# Patient Record
Sex: Female | Born: 1990 | Hispanic: Yes | Marital: Married | Smoking: Never smoker
Health system: Southern US, Community
[De-identification: ages and names within clinical notes are randomized; demographics above are authoritative.]

## PROBLEM LIST (undated history)

## (undated) DIAGNOSIS — E282 Polycystic ovarian syndrome: Secondary | ICD-10-CM

## (undated) DIAGNOSIS — I7300 Raynaud's syndrome without gangrene: Secondary | ICD-10-CM

## (undated) DIAGNOSIS — G4700 Insomnia, unspecified: Secondary | ICD-10-CM

## (undated) DIAGNOSIS — F329 Major depressive disorder, single episode, unspecified: Secondary | ICD-10-CM

## (undated) DIAGNOSIS — F419 Anxiety disorder, unspecified: Secondary | ICD-10-CM

## (undated) DIAGNOSIS — Z8739 Personal history of other diseases of the musculoskeletal system and connective tissue: Secondary | ICD-10-CM

## (undated) DIAGNOSIS — F32A Depression, unspecified: Secondary | ICD-10-CM

## (undated) HISTORY — PX: ANKLE FRACTURE SURGERY: SHX122

---
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
## 2011-01-19 ENCOUNTER — Ambulatory Visit: Payer: Self-pay | Admitting: Family Medicine

## 2011-08-30 ENCOUNTER — Inpatient Hospital Stay: Payer: Self-pay | Admitting: Obstetrics and Gynecology

## 2011-08-30 LAB — CBC WITH DIFFERENTIAL/PLATELET
Basophil #: 0 x10 3/mm 3
Basophil %: 0.3 %
Eosinophil #: 0.2 x10 3/mm 3
Eosinophil %: 1.8 %
HCT: 39.6 %
HGB: 13.4 g/dL
Lymphocyte %: 21.6 %
Lymphs Abs: 2.5 x10 3/mm 3
MCH: 30.4 pg
MCHC: 33.8 g/dL
MCV: 90 fL
Monocyte #: 0.7 x10 3/mm 3
Monocyte %: 6.4 %
Neutrophil #: 7.9 x10 3/mm 3 — ABNORMAL HIGH
Neutrophil %: 69.9 %
Platelet: 259 x10 3/mm 3
RBC: 4.41 X10 6/mm 3
RDW: 13.7 %
WBC: 11.3 x10 3/mm 3 — ABNORMAL HIGH

## 2011-08-30 LAB — PIH PROFILE
Anion Gap: 13
BUN: 7 mg/dL
Calcium, Total: 8.2 mg/dL — ABNORMAL LOW
Chloride: 107 mmol/L
Co2: 21 mmol/L
Creatinine: 0.59 mg/dL — ABNORMAL LOW
EGFR (African American): >60
EGFR (Non-African Amer.): >60
Glucose: 70 mg/dL
HCT: 39.7 %
HGB: 13.3 g/dL
MCH: 30.4 pg
MCHC: 33.4 g/dL
MCV: 91 fL
Osmolality: 278
Platelet: 234 x10 3/mm 3
Potassium: 3.8 mmol/L
RBC: 4.37 X10 6/mm 3
RDW: 13.3 %
SGOT(AST): 28 U/L
Sodium: 141 mmol/L
Uric Acid: 5.7 mg/dL
WBC: 17.7 x10 3/mm 3 — ABNORMAL HIGH

## 2011-08-31 LAB — HEMATOCRIT: HCT: 31.5 % — ABNORMAL LOW (ref 35.0–47.0)

## 2014-10-16 NOTE — H&P (Signed)
L&D Evaluation:  History:   HPI 24 yo G1P0 with LMP of 11/18/10 & EDD of 08/25/11 & L 19 with EDD of 08/20/11 w/ PNC at [facility] significant for Tdap out of date, PMH of pelvic fx w/Rt hip dislocation after fall from rooftop, ROM adequate for vaginal delivery presents to [facility] w/active labor and 3-4 cms.    Patient's Medical History Lactose Intolerance,, Reynaud's, HA's, pelvic fx, UTI    Patient's Surgical History none    Medications Pre Natal Vitamins    Allergies NKDA    Social History none    Family History Non-Contributory   ROS:   ROS All systems were reviewed.  HEENT, CNS, GI, GU, Respiratory, CV, Renal and Musculoskeletal systems were found to be normal.   Exam:   Vital Signs stable    General no apparent distress    Mental Status clear    Chest clear    Heart normal sinus rhythm, no murmur/gallop/rubs    Abdomen gravid, non-tender    Estimated Fetal Weight Average for gestational age    Fetal Position vtx    Back no CVAT    Edema 1+    Reflexes 1+    Clonus negative    Pelvic 3-4/50/vtx    Mebranes Intact    FHT normal rate with no decels, +accels, no decels    Ucx regular    Skin dry    Lymph no lymphadenopathy   Impression:   Impression active labor   Plan:   Plan monitor contractions and for cervical change    Comments GBS neg. Birth plan reviewed and will utilize per pt request. Plans epidural.   Electronic Signatures: Sharee Pimple (CNM)  (Signed 24-Mar-13 06:58)  Authored: L&D Evaluation   Last Updated: 24-Mar-13 06:58 by Sharee Pimple (CNM)

## 2015-03-07 ENCOUNTER — Other Ambulatory Visit: Payer: Self-pay | Admitting: Rheumatology

## 2015-03-07 DIAGNOSIS — R1310 Dysphagia, unspecified: Secondary | ICD-10-CM

## 2015-03-07 DIAGNOSIS — M349 Systemic sclerosis, unspecified: Secondary | ICD-10-CM

## 2015-03-11 ENCOUNTER — Ambulatory Visit: Admission: RE | Admit: 2015-03-11 | Payer: Managed Care, Other (non HMO) | Source: Ambulatory Visit

## 2015-03-26 DIAGNOSIS — E282 Polycystic ovarian syndrome: Secondary | ICD-10-CM | POA: Insufficient documentation

## 2015-03-26 DIAGNOSIS — I7300 Raynaud's syndrome without gangrene: Secondary | ICD-10-CM | POA: Insufficient documentation

## 2015-04-02 ENCOUNTER — Ambulatory Visit
Admission: RE | Admit: 2015-04-02 | Discharge: 2015-04-02 | Disposition: A | Discharge status: Home / Self Care | Payer: Managed Care, Other (non HMO) | Source: Ambulatory Visit | Attending: Rheumatology | Admitting: Rheumatology

## 2015-04-02 DIAGNOSIS — M349 Systemic sclerosis, unspecified: Secondary | ICD-10-CM

## 2015-04-02 DIAGNOSIS — R1310 Dysphagia, unspecified: Secondary | ICD-10-CM | POA: Insufficient documentation

## 2015-04-02 DIAGNOSIS — K219 Gastro-esophageal reflux disease without esophagitis: Secondary | ICD-10-CM | POA: Diagnosis not present

## 2018-03-04 ENCOUNTER — Encounter
Admission: EM | Disposition: A | Discharge status: Home / Self Care | Payer: Self-pay | Source: Home / Self Care | Attending: Emergency Medicine

## 2018-03-04 ENCOUNTER — Emergency Department: Payer: Managed Care, Other (non HMO) | Admitting: Anesthesiology

## 2018-03-04 ENCOUNTER — Emergency Department
Admission: EM | Admit: 2018-03-04 | Discharge: 2018-03-04 | Disposition: A | Discharge status: Home / Self Care | Payer: Managed Care, Other (non HMO) | Attending: Emergency Medicine | Admitting: Emergency Medicine

## 2018-03-04 ENCOUNTER — Encounter: Payer: Self-pay | Admitting: Emergency Medicine

## 2018-03-04 DIAGNOSIS — K210 Gastro-esophageal reflux disease with esophagitis: Secondary | ICD-10-CM | POA: Diagnosis not present

## 2018-03-04 DIAGNOSIS — I7300 Raynaud's syndrome without gangrene: Secondary | ICD-10-CM | POA: Diagnosis not present

## 2018-03-04 DIAGNOSIS — K224 Dyskinesia of esophagus: Secondary | ICD-10-CM | POA: Diagnosis not present

## 2018-03-04 DIAGNOSIS — M349 Systemic sclerosis, unspecified: Secondary | ICD-10-CM | POA: Diagnosis not present

## 2018-03-04 DIAGNOSIS — R1310 Dysphagia, unspecified: Secondary | ICD-10-CM | POA: Insufficient documentation

## 2018-03-04 HISTORY — DX: Raynaud's syndrome without gangrene: I73.00

## 2018-03-04 HISTORY — PX: ESOPHAGOGASTRODUODENOSCOPY (EGD) WITH PROPOFOL: SHX5813

## 2018-03-04 LAB — COMPREHENSIVE METABOLIC PANEL
ALT: 54 U/L — ABNORMAL HIGH (ref 0–44)
AST: 26 U/L (ref 15–41)
Albumin: 4.1 g/dL (ref 3.5–5.0)
Alkaline Phosphatase: 55 U/L (ref 38–126)
Anion gap: 9 (ref 5–15)
BUN: 9 mg/dL (ref 6–20)
CO2: 28 mmol/L (ref 22–32)
Calcium: 9.1 mg/dL (ref 8.9–10.3)
Chloride: 100 mmol/L (ref 98–111)
Creatinine, Ser: 0.54 mg/dL (ref 0.44–1.00)
GFR calc Af Amer: >60 mL/min (ref >60)
GFR calc non Af Amer: >60 mL/min (ref >60)
Glucose, Bld: 99 mg/dL (ref 70–99)
Potassium: 5.3 mmol/L — ABNORMAL HIGH (ref 3.5–5.1)
Sodium: 137 mmol/L (ref 135–145)
Total Bilirubin: 1.8 mg/dL — ABNORMAL HIGH (ref 0.3–1.2)
Total Protein: 7.8 g/dL (ref 6.5–8.1)

## 2018-03-04 LAB — CBC
HCT: 42.2 % (ref 35.0–47.0)
Hemoglobin: 14.3 g/dL (ref 12.0–16.0)
MCH: 28.8 pg (ref 26.0–34.0)
MCHC: 33.9 g/dL (ref 32.0–36.0)
MCV: 84.9 fL (ref 80.0–100.0)
Platelets: 122 10*3/uL — ABNORMAL LOW (ref 150–440)
RBC: 4.97 MIL/uL (ref 3.80–5.20)
RDW: 13.5 % (ref 11.5–14.5)
WBC: 11.2 10*3/uL — ABNORMAL HIGH (ref 3.6–11.0)

## 2018-03-04 LAB — LIPASE, BLOOD: Lipase: 30 U/L (ref 11–51)

## 2018-03-04 SURGERY — ESOPHAGOGASTRODUODENOSCOPY (EGD) WITH PROPOFOL
Anesthesia: General

## 2018-03-04 MED ORDER — MIDAZOLAM HCL 5 MG/5ML IJ SOLN
INTRAMUSCULAR | Status: DC | PRN
  Administered 2018-03-04: 2 mg via INTRAVENOUS

## 2018-03-04 MED ORDER — GI COCKTAIL ~~LOC~~
30.0000 mL | Freq: Once | ORAL | Status: AC
  Administered 2018-03-04: 30 mL via ORAL
  Filled 2018-03-04: qty 30

## 2018-03-04 MED ORDER — FENTANYL CITRATE (PF) 100 MCG/2ML IJ SOLN
INTRAMUSCULAR | Status: DC | PRN
  Administered 2018-03-04 (×2): 50 ug via INTRAVENOUS

## 2018-03-04 MED ORDER — LIDOCAINE HCL (PF) 2 % IJ SOLN
INTRAMUSCULAR | Status: DC | PRN
  Administered 2018-03-04: 60 mg

## 2018-03-04 MED ORDER — LIDOCAINE HCL (PF) 2 % IJ SOLN
INTRAMUSCULAR | Status: AC
  Filled 2018-03-04: qty 20

## 2018-03-04 MED ORDER — PROPOFOL 500 MG/50ML IV EMUL
INTRAVENOUS | Status: DC | PRN
  Administered 2018-03-04: 70 ug/kg/min via INTRAVENOUS

## 2018-03-04 MED ORDER — FENTANYL CITRATE (PF) 100 MCG/2ML IJ SOLN
INTRAMUSCULAR | Status: AC
  Filled 2018-03-04: qty 2

## 2018-03-04 MED ORDER — PROPOFOL 10 MG/ML IV BOLUS
INTRAVENOUS | Status: DC | PRN
  Administered 2018-03-04: 20 mg via INTRAVENOUS
  Administered 2018-03-04: 30 mg via INTRAVENOUS

## 2018-03-04 MED ORDER — OMEPRAZOLE 40 MG PO CPDR: 40.0000 mg | DELAYED_RELEASE_CAPSULE | Freq: Two times a day (BID) | ORAL | 0 refills | Status: DC

## 2018-03-04 MED ORDER — MIDAZOLAM HCL 2 MG/2ML IJ SOLN
INTRAMUSCULAR | Status: AC
  Filled 2018-03-04: qty 2

## 2018-03-04 MED ORDER — ONDANSETRON HCL 4 MG/2ML IJ SOLN
4.0000 mg | Freq: Once | INTRAMUSCULAR | Status: AC
  Administered 2018-03-04: 4 mg via INTRAVENOUS
  Filled 2018-03-04: qty 2

## 2018-03-04 MED ORDER — SUCRALFATE 1 GM/10ML PO SUSP: 1.0000 g | Freq: Three times a day (TID) | ORAL | 0 refills | Status: DC

## 2018-03-04 MED ORDER — SODIUM CHLORIDE 0.9 % IV SOLN
INTRAVENOUS | Status: DC
  Administered 2018-03-04: 1000 mL via INTRAVENOUS

## 2018-03-04 MED ORDER — SODIUM CHLORIDE 0.9 % IV SOLN
1000.0000 mL | Freq: Once | INTRAVENOUS | Status: AC
  Administered 2018-03-04: 1000 mL via INTRAVENOUS

## 2018-03-04 MED ORDER — PROPOFOL 500 MG/50ML IV EMUL
INTRAVENOUS | Status: AC
  Filled 2018-03-04: qty 50

## 2018-03-04 NOTE — Transfer of Care (Signed)
Immediate Anesthesia Transfer of Care Note  Patient: Angelica Valenzuela  Procedure(s) Performed: ESOPHAGOGASTRODUODENOSCOPY (EGD) WITH PROPOFOL (N/A )  Patient Location: PACU  Anesthesia Type:General  Level of Consciousness: sedated  Airway & Oxygen Therapy: Patient Spontanous Breathing and Patient connected to nasal cannula oxygen  Post-op Assessment: Report given to RN and Post -op Vital signs reviewed and stable  Post vital signs: Reviewed and stable  Last Vitals:  Vitals Value Taken Time  BP 110/82 03/04/2018  1:46 PM  Temp 36.1 C 03/04/2018  1:40 PM  Pulse 74 03/04/2018  1:48 PM  Resp 15 03/04/2018  1:48 PM  SpO2 100 % 03/04/2018  1:48 PM  Vitals shown include unvalidated device data.  Last Pain:  Vitals:   03/04/18 1000  TempSrc:   PainSc: 0-No pain         Complications: No apparent anesthesia complications

## 2018-03-04 NOTE — Anesthesia Postprocedure Evaluation (Signed)
Anesthesia Post Note  Patient: Angelica Valenzuela  Procedure(s) Performed: ESOPHAGOGASTRODUODENOSCOPY (EGD) WITH PROPOFOL (N/A )  Patient location during evaluation: Endoscopy Anesthesia Type: General Level of consciousness: awake and alert Pain management: pain level controlled Vital Signs Assessment: post-procedure vital signs reviewed and stable Respiratory status: spontaneous breathing, nonlabored ventilation, respiratory function stable and patient connected to nasal cannula oxygen Cardiovascular status: blood pressure returned to baseline and stable Postop Assessment: no apparent nausea or vomiting Anesthetic complications: no     Last Vitals:  Vitals:   03/04/18 1407 03/04/18 1410  BP: 112/76   Pulse: 65 71  Resp: 12   Temp:    SpO2: 100% 100%    Last Pain:  Vitals:   03/04/18 1000  TempSrc:   PainSc: 0-No pain                 Tobi Groesbeck S

## 2018-03-04 NOTE — ED Notes (Signed)
Pt reports shortness of breath and "esophagus feeling up with mucus" since this am - denies nasal congestion or runny nose - pt appears in NAD at this time with respirations that are even and unlabored Vomiting (x4 in 24 hours) and diarrhea (x6 in 24 hours)

## 2018-03-04 NOTE — Consult Note (Signed)
Wyline Mood , MD [address], [address], [place], [place], [address] [address], [address], [place], [place], [address] Phone: [phone]  Fax: [phone]  Consultation  Referring Provider:     ER Primary Care Physician:  [facility] Primary Gastroenterologist:  None          Reason for Consultation:     Inability to swallow   Date of Admission:  03/04/2018 Date of Consultation:  03/04/2018         HPI:   Lilygrace Rodick is a 27 y.o. female presents to the emergency room for short while back with inability to keep any food down.  Dr. Roselind Messier from the ER contacted me and said even a GI cocktail would not stay down would come back right up,  she had a history of scleroderma and has had issues with swallowing in the past and at this point of time there is a concern for a food impaction and inability for the food to go down.  She says this has been ongoing for a few weeks and getting worse.  History reviewed. No pertinent past medical history.  History reviewed. No pertinent surgical history.  Prior to Admission medications   Not on File    No family history on file.   Social History   Tobacco Use  . Smoking status: Not on file  Substance Use Topics  . Alcohol use: Not on file  . Drug use: Not on file    Allergies as of 03/04/2018  . (Not on File)    Review of Systems:    All systems reviewed and negative except where noted in HPI.   Physical Exam:  Vital signs in last 24 hours: Temp:  [98.6 F (37 C)] 98.6 F (37 C) (09/27 0959) Pulse Rate:  [41] 41 (09/27 0959) Resp:  [20] 20 (09/27 0959) BP: (105)/(61) 105/61 (09/27 0959) Weight:  [54.4 kg] 54.4 kg (09/27 1000)   General:   Pleasant, cooperative in NAD Head:  Normocephalic and atraumatic. Eyes:   No icterus.   Conjunctiva pink. PERRLA. Ears:  Normal auditory acuity. Neck:  Supple; no masses or thyroidomegaly Lungs: Respirations even and unlabored. Lungs clear to auscultation  bilaterally.   No wheezes, crackles, or rhonchi.  Heart:  Regular rate and rhythm;  Without murmur, clicks, rubs or gallops Abdomen:  Soft, nondistended, nontender. Normal bowel sounds. No appreciable masses or hepatomegaly.  No rebound or guarding.  Neurologic:  Alert and oriented x3;  grossly normal neurologically. Skin:  Intact without significant lesions or rashes. Cervical Nodes:  No significant cervical adenopathy. Psych:  Alert and cooperative. Normal affect.  LAB RESULTS: Recent Labs    03/04/18 1042  WBC 11.2*  HGB 14.3  HCT 42.2  PLT 122*   BMET Recent Labs    03/04/18 1042  NA 137  K 5.3*  CL 100  CO2 28  GLUCOSE 99  BUN 9  CREATININE 0.54  CALCIUM 9.1   LFT Recent Labs    03/04/18 1042  PROT 7.8  ALBUMIN 4.1  AST 26  ALT 54*  ALKPHOS 55  BILITOT 1.8*   PT/INR No results for input(s): LABPROT, INR in the last 72 hours.  STUDIES: No results found.    Impression / Plan:   Nevah Dalal is a 27 y.o. y/o female with history of scleroderma short onset of inability to swallow even to keep a small amount of liquids down concerning for food impaction.   1.  EGD for possible food impaction.  I have discussed alternative options, risks & benefits,  which include, but are not limited to, bleeding, infection, perforation,respiratory complication & drug reaction.  The patient agrees with this plan & written consent will be obtained.     Thank you for involving me in the care of this patient.      LOS: 0 days   Wyline Mood, MD  03/04/2018, 11:30 AM

## 2018-03-04 NOTE — Anesthesia Preprocedure Evaluation (Signed)
Anesthesia Evaluation  Patient identified by MRN, date of birth, ID band Patient awake    Reviewed: Allergy & Precautions, NPO status , Patient's Chart, lab work & pertinent test results, reviewed documented beta blocker date and time   Airway Mallampati: II  TM Distance: >3 FB     Dental  (+) Chipped   Pulmonary           Cardiovascular      Neuro/Psych    GI/Hepatic   Endo/Other    Renal/GU      Musculoskeletal   Abdominal   Peds  Hematology   Anesthesia Other Findings   Reproductive/Obstetrics                             Anesthesia Physical Anesthesia Plan  ASA: II  Anesthesia Plan: General   Post-op Pain Management:    Induction: Intravenous  PONV Risk Score and Plan:   Airway Management Planned:   Additional Equipment:   Intra-op Plan:   Post-operative Plan:   Informed Consent: I have reviewed the patients History and Physical, chart, labs and discussed the procedure including the risks, benefits and alternatives for the proposed anesthesia with the patient or authorized representative who has indicated his/her understanding and acceptance.     Plan Discussed with: CRNA  Anesthesia Plan Comments:         Anesthesia Quick Evaluation  

## 2018-03-04 NOTE — ED Provider Notes (Signed)
[facility] Emergency Department Provider Note   ____________________________________________    I have reviewed the triage vital signs and the nursing notes.   HISTORY  Chief Complaint Difficulty with p.o.    HPI Angelica Valenzuela is a 27 y.o. female with a history of scleroderma and Raynaud syndrome who presents with difficulty tolerating p.o.'s.  Patient reports over the last month she is having increasing difficulty with tolerating p.o.'s, over the last several days she has had difficulty even just with water.  She feels that it gets stuck in her esophagus and then she vomits it back up again.  She is not currently on any medications for her rheumatologic diseases.  Denies abdominal pain.  No fevers or chills.  Does have chronic diarrhea as well.   Past Medical History:  Diagnosis Date  . Raynaud's disease     There are no active problems to display for this patient.   History reviewed. No pertinent surgical history.  Prior to Admission medications   Medication Sig Start Date End Date Taking? Authorizing Provider  omeprazole (PRILOSEC) 40 MG capsule Take 1 capsule (40 mg total) by mouth 2 (two) times daily. 03/04/18 04/03/19  Wyline [place], MD  sucralfate (CARAFATE) 1 GM/10ML suspension Take 10 mLs (1 g total) by mouth 4 (four) times daily -  with meals and at bedtime. 03/04/18   Wyline Mood, MD     Allergies Patient has no known allergies.  History reviewed. No pertinent family history.  Social History Social History   Tobacco Use  . Smoking status: Not on file  Substance Use Topics  . Alcohol use: Not on file  . Drug use: Not on file    Review of Systems  Constitutional: No fever/chills Eyes: No visual changes.  ENT: No sore throat. Cardiovascular: Denies chest pain. Respiratory: Denies shortness of breath. Gastrointestinal: As above Genitourinary: Negative for dysuria. Musculoskeletal: Negative for back pain. Skin: Negative  for rash. Neurological: Negative for headaches    ____________________________________________   PHYSICAL EXAM:  VITAL SIGNS: ED Triage Vitals  Enc Vitals Group     BP 03/04/18 0959 105/61     Pulse Rate 03/04/18 0959 (!) 41     Resp 03/04/18 0959 20     Temp 03/04/18 0959 98.6 F (37 C)     Temp Source 03/04/18 0959 Oral     SpO2 --      Weight 03/04/18 1000 54.4 kg (120 lb)     Height 03/04/18 1000 1.651 m (5\' 5" )     Head Circumference --      Peak Flow --      Pain Score 03/04/18 1000 0     Pain Loc --      Pain Edu? --      Excl. in GC? --     Constitutional: Alert and oriented. No acute distress. Pleasant and interactive  Nose: No congestion/rhinnorhea. Mouth/Throat: Mucous membranes are moist.  Pharynx appears normal, no erythema Neck:  Painless ROM, no lymphadenopathy Cardiovascular: Normal rate, regular rhythm.  Good peripheral circulation. Respiratory: Normal respiratory effort.  No retractions. Lungs CTAB.  No stridor Gastrointestinal: Soft and nontender. No distention.  No CVA tenderness.  Musculoskeletal: .  Warm and well perfused Neurologic:  Normal speech and language. No gross focal neurologic deficits are appreciated.  Skin:  Skin is warm, dry and intact. No rash noted. Psychiatric: Mood and affect are normal. Speech and behavior are normal.  ____________________________________________   LABS (all labs ordered  are listed, but only abnormal results are displayed)  Labs Reviewed  CBC - Abnormal; Notable for the following components:      Result Value   WBC 11.2 (*)    Platelets 122 (*)    All other components within normal limits  COMPREHENSIVE METABOLIC PANEL - Abnormal; Notable for the following components:   Potassium 5.3 (*)    ALT 54 (*)    Total Bilirubin 1.8 (*)    All other components within normal limits  LIPASE, BLOOD  SURGICAL PATHOLOGY    ____________________________________________  EKG  None ____________________________________________  RADIOLOGY  None ____________________________________________   PROCEDURES  Procedure(s) performed: No  Procedures   Critical Care performed: No ____________________________________________   INITIAL IMPRESSION / ASSESSMENT AND PLAN / ED COURSE  Pertinent labs & imaging results that were available during my care of the patient were reviewed by me and considered in my medical decision making (see chart for details).  Patient presents with difficulty swallowing, concerned this is related to her scleroderma.  Discussed with Dr. Tobi Bastos of GI, he will take her to endoscopy    ____________________________________________   FINAL CLINICAL IMPRESSION(S) / ED DIAGNOSES  Esophageal dysmotility     Note:  This document was prepared using Dragon voice recognition software and may include unintentional dictation errors.    Jene Every, MD 03/04/18 [phone]

## 2018-03-04 NOTE — Op Note (Signed)
[facility] Gastroenterology Patient Name: Zen Felling Procedure Date: 03/04/2018 1:32 PM MRN: [id number] Account #: [id number] Date of Birth: 05-15-1991 Admit Type: Outpatient Age: 27 Room: [facility] ENDO ROOM 4 Gender: Female Note Status: Finalized Procedure:            Upper GI endoscopy Indications:          Dysphagia Providers:            Wyline [place] MD, MD Referring MD:         No Local Md, MD (Referring MD) Medicines:            Monitored Anesthesia Care Complications:        No immediate complications. Procedure:            Pre-Anesthesia Assessment:                       - Prior to the procedure, a History and Physical was                        performed, and patient medications, allergies and                        sensitivities were reviewed. The patient's tolerance of                        previous anesthesia was reviewed.                       - The risks and benefits of the procedure and the                        sedation options and risks were discussed with the                        patient. All questions were answered and informed                        consent was obtained.                       - ASA Grade Assessment: II - A patient with mild                        systemic disease.                       After obtaining informed consent, the endoscope was                        passed under direct vision. Throughout the procedure,                        the patient's blood pressure, pulse, and oxygen                        saturations were monitored continuously. The Endoscope                        was introduced through the mouth, and advanced to the  third part of duodenum. The upper GI endoscopy was                        accomplished with ease. The patient tolerated the                        procedure well. Findings:      The examined duodenum was normal.      The stomach was normal.      The cardia and gastric  fundus were normal on retroflexion.      LA Grade C (one or more mucosal breaks continuous between tops of 2 or       more mucosal folds, less than 75% circumference) esophagitis with       bleeding was found in the lower third of the esophagus.      Normal mucosa was found in the upper third of the esophagus and in the       middle third of the esophagus. Biopsies were obtained from the proximal       and distal esophagus with cold forceps for histology of suspected       eosinophilic esophagitis.      Abnormal motility was noted in the esophagus. The cricopharyngeus was       abnormal. There is a decrease in motility of the esophageal body. The       distal esophagus/lower esophageal sphincter is open. Impression:           - Normal examined duodenum.                       - Normal stomach.                       - LA Grade C reflux esophagitis.                       - Normal mucosa was found in the upper third of the                        esophagus and in the middle third of the esophagus.                        Biopsied.                       - Abnormal esophageal motility, suspicious for                        aperistalsis. Recommendation:       - Discharge patient to home (with escort).                       - Full liquid diet for 2 days.                       - Prilosec 40 mg BID, carafate QID, kep head end of the                        bed eleavted at al times. Small meals more often , low                        in fat and  fiber .                       F/u in my office in 2 weeks Procedure Code(s):    --- Professional ---                       [phone], Esophagogastroduodenoscopy, flexible, transoral;                        with biopsy, single or multiple Diagnosis Code(s):    --- Professional ---                       K21.0, Gastro-esophageal reflux disease with esophagitis                       K22.4, Dyskinesia of esophagus                       R13.10, Dysphagia,  unspecified CPT copyright 2017 American Medical Association. All rights reserved. The codes documented in this report are preliminary and upon coder review may  be revised to meet current compliance requirements. Wyline Mood, MD Wyline Mood MD, MD 03/04/2018 1:44:15 PM This report has been signed electronically. Number of Addenda: 0 Note Initiated On: 03/04/2018 1:32 PM      [facility]

## 2018-03-04 NOTE — ED Triage Notes (Signed)
Pt reports has been getting a lot of mucous in her throat and has a sore throat and feels like she can not breath well. Pt denies pain. Reports sx's for awhile and is followed by a specialist reynaulds and scleroderma. Pt called her specialist but they were not able to see her today.

## 2018-03-04 NOTE — H&P (Signed)
         Wyline Mood, MD [address], [address], [place], [place], [address] [address], [place], [place], [address] Phone: [phone]  Fax: [phone]  Primary Care Physician:  [facility]   Pre-Procedure History & Physical: HPI:  Angelica Valenzuela is a 27 y.o. female is here for an endoscopy    Past Medical History:  Diagnosis Date  . Raynaud's disease     History reviewed. No pertinent surgical history.  Prior to Admission medications   Not on File    Allergies as of 03/04/2018  . (No Known Allergies)    History reviewed. No pertinent family history.  Social History   Socioeconomic History  . Marital status: Married    Spouse name: Not on file  . Number of children: Not on file  . Years of education: Not on file  . Highest education level: Not on file  Occupational History  . Not on file  Social Needs  . Financial resource strain: Not on file  . Food insecurity:    Worry: Not on file    Inability: Not on file  . Transportation needs:    Medical: Not on file    Non-medical: Not on file  Tobacco Use  . Smoking status: Not on file  Substance and Sexual Activity  . Alcohol use: Not on file  . Drug use: Not on file  . Sexual activity: Not on file  Lifestyle  . Physical activity:    Days per week: Not on file    Minutes per session: Not on file  . Stress: Not on file  Relationships  . Social connections:    Talks on phone: Not on file    Gets together: Not on file    Attends religious service: Not on file    Active member of club or organization: Not on file    Attends meetings of clubs or organizations: Not on file    Relationship status: Not on file  . Intimate partner violence:    Fear of current or ex partner: Not on file    Emotionally abused: Not on file    Physically abused: Not on file    Forced sexual activity: Not on file  Other Topics Concern  . Not on file  Social History Narrative  . Not on  file    Review of Systems: See HPI, otherwise negative ROS  Physical Exam: BP 112/60 (BP Location: Left Arm)   Pulse 61   Temp 98.6 F (37 C) (Oral)   Resp 16   Ht 5\' 5"  (1.651 m)   Wt 54.4 kg   LMP 01/25/2018 (Approximate) Comment: negative u preg  SpO2 100%   BMI 19.97 kg/m  General:   Alert,  pleasant and cooperative in NAD Head:  Normocephalic and atraumatic. Neck:  Supple; no masses or thyromegaly. Lungs:  Clear throughout to auscultation, normal respiratory effort.    Heart:  +S1, +S2, Regular rate and rhythm, No edema. Abdomen:  Soft, nontender and nondistended. Normal bowel sounds, without guarding, and without rebound.   Neurologic:  Alert and  oriented x4;  grossly normal neurologically.  Impression/Plan: [name] is here for an endoscopy  to be performed for  evaluation of possible food impaction    Risks, benefits, limitations, and alternatives regarding endoscopy have been reviewed with the patient.  Questions have been answered.  All parties agreeable.   Ferdinand Lango, MD  03/04/2018, 1:27 PM

## 2018-03-04 NOTE — ED Triage Notes (Signed)
Difficulty getting saturations due to Reynaulds.

## 2018-03-04 NOTE — Anesthesia Post-op Follow-up Note (Signed)
Anesthesia QCDR form completed.        

## 2018-03-07 ENCOUNTER — Encounter: Payer: Self-pay | Admitting: Gastroenterology

## 2018-03-07 ENCOUNTER — Telehealth: Payer: Self-pay | Admitting: Gastroenterology

## 2018-03-07 NOTE — Telephone Encounter (Signed)
Pt left vm she Saw Dr. Tobi Bastos  In Ed she states she still having Acid build up and feels swollen she would like rx for the swelling please call pt

## 2018-03-08 LAB — SURGICAL PATHOLOGY

## 2018-03-11 ENCOUNTER — Other Ambulatory Visit: Payer: Self-pay

## 2018-03-11 MED ORDER — SUCRALFATE 1 GM/10ML PO SUSP: 1.0000 g | Freq: Three times a day (TID) | ORAL | 0 refills | Status: DC

## 2018-03-11 MED ORDER — OMEPRAZOLE 40 MG PO CPDR: 40.0000 mg | DELAYED_RELEASE_CAPSULE | Freq: Two times a day (BID) | ORAL | 0 refills | Status: DC

## 2018-03-11 NOTE — Telephone Encounter (Signed)
Left vm informing pt I was not understanding her message about being "swollen" and needing a rx. Advised pt Dr. Tobi Bastos was expecting a follow up appt 2 weeks after her procedure. Advised her to contact the office to schedule appt.

## 2018-03-17 ENCOUNTER — Ambulatory Visit: Payer: Managed Care, Other (non HMO) | Admitting: Gastroenterology

## 2018-03-21 ENCOUNTER — Other Ambulatory Visit: Payer: Self-pay | Admitting: Gastroenterology

## 2018-03-25 ENCOUNTER — Telehealth: Payer: Self-pay

## 2018-03-25 NOTE — Telephone Encounter (Signed)
Called pt to inform her of biopsy results. LVM to return call

## 2018-03-25 NOTE — Telephone Encounter (Signed)
-----   Message from Wyline Mood, MD sent at 03/20/2018  2:15 PM EDT ----- Inform biopsies of esophagus showed inflammation from acid reflux. Suggest to continue PPI BID

## 2018-03-28 ENCOUNTER — Other Ambulatory Visit: Payer: Self-pay | Admitting: Gastroenterology

## 2018-03-28 NOTE — Telephone Encounter (Signed)
Spoke with Angelica Valenzuela and informed her of her biopsy results and Dr. Johnney Killian instructions. Angelica Valenzuela states she is taking the medications Dr. Tobi Bastos prescribed but she is still experiencing the sensation of something being stuck in her throat. Angelica Valenzuela requests Dr. Johnney Killian advice. I explained that I will consult with Dr. Tobi Bastos and then advise.

## 2018-03-28 NOTE — Telephone Encounter (Signed)
-----   Message from Kiran Anna, MD sent at 03/20/2018  2:15 PM EDT ----- Inform biopsies of esophagus showed inflammation from acid reflux. Suggest to continue PPI BID 

## 2018-03-29 NOTE — Telephone Encounter (Signed)
Spoke with pt and informed her Dr. Tobi Bastos states he will need to see her in the office because pt has not been seen since her ED visit. Pt has been scheduled for an office visit in late November. I explained that we will add her to the cancellation waiting list to possibly get a sooner appointment.

## 2018-05-02 ENCOUNTER — Ambulatory Visit: Payer: Managed Care, Other (non HMO) | Admitting: Gastroenterology

## 2018-05-02 ENCOUNTER — Encounter: Payer: Self-pay | Admitting: Gastroenterology

## 2018-05-02 ENCOUNTER — Encounter

## 2018-05-02 NOTE — Progress Notes (Deleted)
Summary of history :  The patient is here today to see me as a hospital follow-up.  He presented to the emergency room on 03/04/2018 with an inability to keep food down.  He had a history of scleroderma and has had issues with swallowing the past and is a concern for food impaction and hence I was consulted.  I performed an EGD on 03/04/2018: LA grade C severe esophagitis was seen in the lower end of the esophagus.  GE junction was wide open.  Decreased motility was seen at the lower end of the esophagus.  Commenced on high-dose PPI and advised to be discharged.  Biopsies of the esophagus to rule out EOE showed no intraepithelial eosinophils but showed features suggestive of reflux.  Interval history 03/04/2018 to 05/02/2018   Here to follow-up for dysphagia when she was seen in 02/25/2018.  She has history of scleroderma.  EGD demonstrated severe esophagitis.  Likely secondary to acid reflux made worse by scleroderma and possible esophageal and gastric dysmotility.  Plan 1.  High-dose PPI twice daily. 2.  Avoid eating for 2 hours before going to bed. 3.  Keep head end of the bed elevated at 45 degree angle at night to prevent nocturnal acid reflux. 4.  Low-fat diet.  ***

## 2018-05-03 ENCOUNTER — Ambulatory Visit: Payer: Managed Care, Other (non HMO) | Admitting: Gastroenterology

## 2018-05-10 ENCOUNTER — Other Ambulatory Visit: Payer: Self-pay | Admitting: Gastroenterology

## 2018-05-13 ENCOUNTER — Other Ambulatory Visit: Payer: Self-pay | Admitting: Obstetrics & Gynecology

## 2018-05-13 DIAGNOSIS — N979 Female infertility, unspecified: Secondary | ICD-10-CM

## 2019-01-16 ENCOUNTER — Other Ambulatory Visit: Payer: Managed Care, Other (non HMO)

## 2019-01-23 ENCOUNTER — Other Ambulatory Visit: Payer: Self-pay | Admitting: Obstetrics & Gynecology

## 2019-01-23 DIAGNOSIS — N979 Female infertility, unspecified: Secondary | ICD-10-CM

## 2019-01-25 ENCOUNTER — Ambulatory Visit (HOSPITAL_COMMUNITY): Payer: Managed Care, Other (non HMO)

## 2019-01-27 ENCOUNTER — Ambulatory Visit
Admission: RE | Admit: 2019-01-27 | Discharge: 2019-01-27 | Disposition: A | Discharge status: Home / Self Care | Payer: Managed Care, Other (non HMO) | Source: Ambulatory Visit | Attending: Obstetrics & Gynecology | Admitting: Obstetrics & Gynecology

## 2019-01-27 ENCOUNTER — Other Ambulatory Visit: Payer: Self-pay

## 2019-01-27 DIAGNOSIS — N979 Female infertility, unspecified: Secondary | ICD-10-CM | POA: Insufficient documentation

## 2019-01-27 MED ORDER — IOHEXOL 300 MG/ML  SOLN
3.0000 mL | Freq: Once | INTRAMUSCULAR | Status: AC | PRN
  Administered 2019-01-27: 3 mL via INTRATHECAL

## 2019-02-08 NOTE — Procedures (Signed)
[id number] Angelica Valenzuela [birth year] 28 y.o.   Hysterosalpingogram Procedure Note  Date of procedure: 01/27/2019   Pre-operative Diagnosis: Infertility  Post-operative Diagnosis: same, patent bilateral fallopian tubes, right deviated uterus  Procedure: Hysterosalpingogram  Surgeon: Larey Days, MD  Assistant(s):  Radiology assistant. Today's performing radiologist read the imaging and agreed with findings below.  Anesthesia: None  Estimated Blood Loss:  None         Complications:  None apparent  Disposition: To home         Condition: stable, minimal cramping  Findings: Bilateral fill and spill of the tubes and a normal endometrial contour was noted.  The uterus was right deviated in the pelvis  Procedure Details  HSG procedure discussed with the patient.  Risks, complications, alternatives have been reviewed with her and she agrees to proceed.   The patient presented to the radiology lab and was identified as the correct patient and the procedure verified as an HSG. A verbal Time Out was held with all team members present and in agreement.  Speculum was inserted in to the vagina and the cervix visualized.  The cervix was cleaned with betadine solution. The HSG catheter was inserted and the balloon insufflated with approximately 1.5 ml of air.  Patient was then repositioned for fluoroscopy.  A total of 86ml of contrast was used for the procedure. The patient tolerated the procedure well, no complications.   Bilateral fill and spill of the tubes and a normal endometrial contour was noted.  Results were reviewed with the patient at the time of the procedure. She verbalized understanding.   ----- Larey Days, MD Attending Obstetrician and Gynecologist [facility]

## 2019-06-09 NOTE — L&D Delivery Note (Signed)
Delivery Note  First Stage: Labor onset: 1800 on 12/21/19 Augmentation: AROM  Analgesia /Anesthesia intrapartum: nitrous oxide AROM at 0614  Second Stage: Complete dilation at 0613 Onset of pushing at 0614 FHR second stage Cat I  Delivery of a viable female infant on 12/22/19 at 0630 by CNM delivery of fetal head in LOA position with restitution to LOT. No nuchal cord;  Anterior then posterior shoulders delivered easily with gentle downward traction. Baby placed on mom's chest, and attended to by peds.  Cord double clamped after cessation of pulsation, cut by FOB  Third Stage: Placenta delivered spontantously intact with 3VC @ 0636 Placenta disposition: to pathology, noted calcifications, marginal cord insertion and velamentous insertion Uterine tone firm / bleeding small  Left labial and midline perineal abrasions identified and hemostatic, no repair.  Anesthesia for repair: n/a Est. Blood Loss (mL): 100  Complications: none  Mom to postpartum.  Baby to Couplet care / Skin to Skin.  Newborn: Birth Weight: pending  Apgar Scores: 8/9 Feeding planned: breast

## 2019-09-28 ENCOUNTER — Other Ambulatory Visit: Payer: Self-pay | Admitting: Obstetrics and Gynecology

## 2019-09-28 DIAGNOSIS — M359 Systemic involvement of connective tissue, unspecified: Secondary | ICD-10-CM

## 2019-10-09 ENCOUNTER — Ambulatory Visit
Admission: RE | Admit: 2019-10-09 | Discharge: 2019-10-09 | Disposition: A | Discharge status: Home / Self Care | Payer: Managed Care, Other (non HMO) | Source: Ambulatory Visit | Attending: Obstetrics and Gynecology | Admitting: Obstetrics and Gynecology

## 2019-10-09 ENCOUNTER — Other Ambulatory Visit: Payer: Self-pay

## 2019-10-09 ENCOUNTER — Ambulatory Visit
Admission: RE | Admit: 2019-10-09 | Discharge: 2019-10-09 | Disposition: A | Discharge status: Home / Self Care | Payer: Managed Care, Other (non HMO) | Source: Ambulatory Visit | Attending: Maternal & Fetal Medicine | Admitting: Maternal & Fetal Medicine

## 2019-10-09 DIAGNOSIS — I7300 Raynaud's syndrome without gangrene: Secondary | ICD-10-CM | POA: Insufficient documentation

## 2019-10-09 DIAGNOSIS — E282 Polycystic ovarian syndrome: Secondary | ICD-10-CM | POA: Diagnosis not present

## 2019-10-09 DIAGNOSIS — Z3A30 30 weeks gestation of pregnancy: Secondary | ICD-10-CM | POA: Insufficient documentation

## 2019-10-09 DIAGNOSIS — O99283 Endocrine, nutritional and metabolic diseases complicating pregnancy, third trimester: Secondary | ICD-10-CM | POA: Insufficient documentation

## 2019-10-09 DIAGNOSIS — Z79899 Other long term (current) drug therapy: Secondary | ICD-10-CM | POA: Diagnosis not present

## 2019-10-09 DIAGNOSIS — M351 Other overlap syndromes: Secondary | ICD-10-CM | POA: Insufficient documentation

## 2019-10-09 DIAGNOSIS — M359 Systemic involvement of connective tissue, unspecified: Secondary | ICD-10-CM

## 2019-10-09 DIAGNOSIS — O99413 Diseases of the circulatory system complicating pregnancy, third trimester: Secondary | ICD-10-CM | POA: Insufficient documentation

## 2019-10-09 DIAGNOSIS — M349 Systemic sclerosis, unspecified: Secondary | ICD-10-CM | POA: Insufficient documentation

## 2019-10-09 HISTORY — DX: Insomnia, unspecified: G47.00

## 2019-10-09 HISTORY — DX: Depression, unspecified: F32.A

## 2019-10-09 HISTORY — DX: Polycystic ovarian syndrome: E28.2

## 2019-10-09 HISTORY — DX: Anxiety disorder, unspecified: F41.9

## 2019-10-09 HISTORY — DX: Personal history of other diseases of the musculoskeletal system and connective tissue: Z87.39

## 2019-10-09 NOTE — Consult Note (Signed)
[facility] Consultation     HPI: [name] is a 28 y.o. G2P1001  At 30 weeks 6 days gestation-- Dating is by sure LMP (uses an APP)consistent with earliest available ultrasound performed at [facility] clinic on 05/18/19 with measurements consistent with 9 weeks 2 days (EDD 12/18/19 by LMP).  She had negative cffDNA screening (MaternitiT21) for Trisomy 13/18/21 and was noted to be consistent with a female fetus.   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  Her sxs consist of arthritis (feet, hands), raynauds,  destruction/involvement of fingertips and sclerodactly.  She reports no history of renal or pulmonary involvement.  She reports negative cardiac echo 5 years ago.   Her APLAS and Ro/La testing were negative 09/26/19   She is currently on placquenil and reports improvement in arthritis symptoms on placquenil.    Detailed anatomic survey today demonstrated size consistent with dates (eephalic, efw 1602g, 56%, nl AFI, movements) today and unremarkable anatomic survey. Please see that report for details.   Past Medical History: Patient  has a past medical history of Raynaud's disease.  MCTD/SSc--as above PCOS Eophagitis  Past Surgical History: She  has a past surgical history that includes Esophagogastroduodenoscopy (egd) with propofol (N/A, 03/04/2018).  Ankle surgery  Obstetric/Gyn History:  2013, SVD, 7lb, female, preeclampsia--was  Scheduled to be induced but went into spontaneous labor prior  04/13/19: negative pap Reports 6 yr h/o infertility.   Current Outpatient Medications on File Prior to Encounter  Medication Sig Dispense Refill  . Dietary Management Product (VASCULERA) TABS Take 600 mg by mouth daily.    . hydroxychloroquine (PLAQUENIL) 200 MG tablet Take 200 mg by mouth.    . Prenatal Vit-Fe Fumarate-FA (PRENATAL MULTIVITAMIN) TABS tablet Take 1 tablet by mouth daily at 12 noon.      No current facility-administered medications on file prior to encounter.    Allergies: Patient has No Known Allergies.  Social History:  Married, works as Print production planner for [employer], medical, never smoked Here today with husband, Angelica Valenzuela. He does not smoke and works in [employer].   Family History  Problem Relation Age of Onset  . High blood pressure (Hypertension) sister   . Cancer Father   . Neuropathy Father   . Colon cancer Father 19  . Diabetes type II Father   . Hyperlipidemia (Elevated cholesterol) Mother      Physical Exam: Vitals:   10/09/19 1348  BP: 107/77  Pulse: 87  Temp: 98 F (36.7 C)   Impression/Plan: 1.  MCTD/Scleroderma (per records): Scleroderma/Systemic Sclerosis (SSc) affects approximately 1 in 100,000 individuals.  The autoimmune disease may be characterized by skin fibrosis/cutaneious involvement, raynauds, telengectasia, esophageal dysmotility and possible vascular involvement, pulmonary fibrosis that may lead to pulmonary hypertension and renal dysfunction.  Pregnancy may be associated with an increased risk for pregnancy loss/spontaneous abortion, preeclampsia, renal disease (particularly in the setting of preexisting renal involvement), preeclampsia, low birth weight/growth restriction, and maternal cardiac dysfunction.  Screening for pulmonary hypertension is particularly warranted in the setting of prior history of pulmonary involvement/alveiolitis in the setting of SSc.   Screening for the presence of SSA and SSB antibodies as well as APLAS is important to address possible risk for neonatal lupus syndrome, and for risks associated with APLAS (e.g maternal vascular/thrombosis risk, early onset preeclampsia, risk for pregnancy loss/recurrent loss).    Recommendations: 1. Refer to Angelica Valenzuela, [facility] for a single consult/review as a  resource due to her long-standing expertise in Rheumatologic conditions during pregnancy.  2.  Recommend baseline APLAS and AntiRo and Anti-La antibody screening (unless Dr. Sed[place] Valenzuela deems not indicated (patient had negative antibody screening one year ago) 3. Baseline liver function testing, and creatinine were normal.  Recommend baseline P;C ratio (24h urine can be performed if abnormal).  4. Low dose aspirin daily for preeclampsia risk reduction--she is too far in gestation to begin therapy now. We addressed this for possible future pregnancies (as long a GI symptoms are not prohibitive) 4. Baseline CBC demonstrated anemia--recommend iron studies.  She is currently not taking additional iron, prior anemia may also be related to underlying autoimmune disease.  5. Recommend serial growth ultrasounds monthly after 24 weeks and weekly antenatal testing after 36 weeks, unless indicated sooner 6. Recommend maternal ECG and baseline echocardiogram.   Thank you for referring this very pleasant patient to our care.   Total time spent with the patient was 30 minutes with greater than 50% spent in counseling and coordination of care. We appreciate this interesting consult and will be happy to be involved in the ongoing care of Angelica Valenzuela in anyway her obstetricians desire.  Angelica Shirts, MD [facility]

## 2019-12-18 ENCOUNTER — Other Ambulatory Visit: Payer: Self-pay | Admitting: Certified Nurse Midwife

## 2019-12-18 ENCOUNTER — Encounter: Payer: Self-pay | Admitting: Certified Nurse Midwife

## 2019-12-18 NOTE — Progress Notes (Signed)
  Angelica Valenzuela is a 29 y.o. G97P1001 female dated by LMP c/w [redacted]w[redacted]d ultrasound.    Pregnancy Issues: 1. History of anxiety and depression in the past, no meds currently 2. 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 Iron deficiency anemia on iron supplementation   Prenatal care site: [facility]    Prenatal Labs: Blood type/Rh A+  Antibody screen neg  Rubella Immune  Varicella Immune  RPR NR  HBsAg Neg  HIV NR  GC neg  Chlamydia neg  Genetic screening cfDNA negative, AFP negative  1 hour GTT 94  3 hour GTT n/a  GBS positive    Post Partum Planning: - Infant feeding: TBD - Contraception: TBD  Tdap: 10/12/2019 Flu: 04/2019

## 2019-12-22 ENCOUNTER — Other Ambulatory Visit: Payer: Self-pay

## 2019-12-22 ENCOUNTER — Inpatient Hospital Stay
Admission: EM | Admit: 2019-12-22 | Discharge: 2019-12-24 | DRG: 806 | Disposition: A | Discharge status: Home / Self Care | Payer: Managed Care, Other (non HMO) | Attending: Obstetrics and Gynecology | Admitting: Obstetrics and Gynecology

## 2019-12-22 ENCOUNTER — Encounter: Payer: Self-pay | Admitting: Obstetrics and Gynecology

## 2019-12-22 DIAGNOSIS — O9081 Anemia of the puerperium: Secondary | ICD-10-CM | POA: Diagnosis not present

## 2019-12-22 DIAGNOSIS — Z3A40 40 weeks gestation of pregnancy: Secondary | ICD-10-CM

## 2019-12-22 DIAGNOSIS — O43123 Velamentous insertion of umbilical cord, third trimester: Secondary | ICD-10-CM | POA: Diagnosis present

## 2019-12-22 DIAGNOSIS — Z20822 Contact with and (suspected) exposure to covid-19: Secondary | ICD-10-CM | POA: Diagnosis present

## 2019-12-22 DIAGNOSIS — O99824 Streptococcus B carrier state complicating childbirth: Secondary | ICD-10-CM | POA: Diagnosis present

## 2019-12-22 DIAGNOSIS — D62 Acute posthemorrhagic anemia: Secondary | ICD-10-CM | POA: Diagnosis not present

## 2019-12-22 DIAGNOSIS — O26893 Other specified pregnancy related conditions, third trimester: Secondary | ICD-10-CM | POA: Diagnosis present

## 2019-12-22 LAB — CBC WITH DIFFERENTIAL/PLATELET
Abs Immature Granulocytes: 0.04 10*3/uL (ref 0.00–0.07)
Basophils Absolute: 0 10*3/uL (ref 0.0–0.1)
Basophils Relative: 0 %
Eosinophils Absolute: 0.1 10*3/uL (ref 0.0–0.5)
Eosinophils Relative: 1 %
HCT: 31.2 % — ABNORMAL LOW (ref 36.0–46.0)
Hemoglobin: 10.3 g/dL — ABNORMAL LOW (ref 12.0–15.0)
Immature Granulocytes: 0 %
Lymphocytes Relative: 10 %
Lymphs Abs: 1.2 10*3/uL (ref 0.7–4.0)
MCH: 25.7 pg — ABNORMAL LOW (ref 26.0–34.0)
MCHC: 33 g/dL (ref 30.0–36.0)
MCV: 77.8 fL — ABNORMAL LOW (ref 80.0–100.0)
Monocytes Absolute: 0.5 10*3/uL (ref 0.1–1.0)
Monocytes Relative: 4 %
Neutro Abs: 10.7 10*3/uL — ABNORMAL HIGH (ref 1.7–7.7)
Neutrophils Relative %: 85 %
Platelets: 255 10*3/uL (ref 150–400)
RBC: 4.01 MIL/uL (ref 3.87–5.11)
RDW: 13.1 % (ref 11.5–15.5)
WBC: 12.5 10*3/uL — ABNORMAL HIGH (ref 4.0–10.5)
nRBC: 0 % (ref 0.0–0.2)

## 2019-12-22 LAB — SARS CORONAVIRUS 2 BY RT PCR (HOSPITAL ORDER, PERFORMED IN ~~LOC~~ HOSPITAL LAB): SARS Coronavirus 2: NEGATIVE

## 2019-12-22 LAB — ABO/RH: ABO/RH(D): A POS

## 2019-12-22 LAB — RPR: RPR Ser Ql: NONREACTIVE

## 2019-12-22 LAB — TYPE AND SCREEN
ABO/RH(D): A POS
Antibody Screen: NEGATIVE

## 2019-12-22 MED ORDER — AMMONIA AROMATIC IN INHA
RESPIRATORY_TRACT | Status: AC
  Filled 2019-12-22: qty 10

## 2019-12-22 MED ORDER — PENICILLIN G POT IN DEXTROSE 60000 UNIT/ML IV SOLN: 3.0000 10*6.[IU] | INTRAVENOUS | Status: DC

## 2019-12-22 MED ORDER — LACTATED RINGERS IV SOLN: INTRAVENOUS | Status: DC

## 2019-12-22 MED ORDER — TERBUTALINE SULFATE 1 MG/ML IJ SOLN: 0.2500 mg | Freq: Once | INTRAMUSCULAR | Status: DC | PRN

## 2019-12-22 MED ORDER — MISOPROSTOL 200 MCG PO TABS
ORAL_TABLET | ORAL | Status: AC
  Filled 2019-12-22: qty 4

## 2019-12-22 MED ORDER — MISOPROSTOL 25 MCG QUARTER TABLET: 25.0000 ug | ORAL_TABLET | ORAL | Status: DC | PRN

## 2019-12-22 MED ORDER — OXYTOCIN-SODIUM CHLORIDE 30-0.9 UT/500ML-% IV SOLN
2.5000 [IU]/h | INTRAVENOUS | Status: DC
  Filled 2019-12-22: qty 500

## 2019-12-22 MED ORDER — ONDANSETRON HCL 4 MG/2ML IJ SOLN: 4.0000 mg | Freq: Four times a day (QID) | INTRAMUSCULAR | Status: DC | PRN

## 2019-12-22 MED ORDER — ONDANSETRON HCL 4 MG PO TABS: 4.0000 mg | ORAL_TABLET | ORAL | Status: DC | PRN

## 2019-12-22 MED ORDER — IBUPROFEN 600 MG PO TABS
600.0000 mg | ORAL_TABLET | Freq: Four times a day (QID) | ORAL | Status: DC
  Administered 2019-12-22 – 2019-12-23 (×4): 600 mg via ORAL
  Filled 2019-12-22 (×4): qty 1

## 2019-12-22 MED ORDER — OXYTOCIN-SODIUM CHLORIDE 30-0.9 UT/500ML-% IV SOLN: 1.0000 m[IU]/min | INTRAVENOUS | Status: DC

## 2019-12-22 MED ORDER — HYDROXYCHLOROQUINE SULFATE 200 MG PO TABS
200.0000 mg | ORAL_TABLET | Freq: Two times a day (BID) | ORAL | Status: DC
  Administered 2019-12-22 – 2019-12-24 (×5): 200 mg via ORAL
  Filled 2019-12-22 (×7): qty 1

## 2019-12-22 MED ORDER — DIPHENHYDRAMINE HCL 25 MG PO CAPS: 25.0000 mg | ORAL_CAPSULE | Freq: Four times a day (QID) | ORAL | Status: DC | PRN

## 2019-12-22 MED ORDER — COCONUT OIL OIL
1.0000 "application " | TOPICAL_OIL | Status: DC | PRN
  Administered 2019-12-22: 1 via TOPICAL
  Filled 2019-12-22: qty 120

## 2019-12-22 MED ORDER — OXYTOCIN 10 UNIT/ML IJ SOLN
INTRAMUSCULAR | Status: AC
  Filled 2019-12-22: qty 2

## 2019-12-22 MED ORDER — OXYTOCIN-SODIUM CHLORIDE 30-0.9 UT/500ML-% IV SOLN
INTRAVENOUS | Status: AC
  Filled 2019-12-22: qty 500

## 2019-12-22 MED ORDER — SODIUM CHLORIDE 0.9 % IV SOLN
2.0000 g | Freq: Once | INTRAVENOUS | Status: AC
  Administered 2019-12-22: 2 g via INTRAVENOUS

## 2019-12-22 MED ORDER — SOD CITRATE-CITRIC ACID 500-334 MG/5ML PO SOLN: 30.0000 mL | ORAL | Status: DC | PRN

## 2019-12-22 MED ORDER — ACETAMINOPHEN 325 MG PO TABS
650.0000 mg | ORAL_TABLET | ORAL | Status: DC | PRN
  Administered 2019-12-22 – 2019-12-24 (×7): 650 mg via ORAL
  Filled 2019-12-22 (×10): qty 2

## 2019-12-22 MED ORDER — LACTATED RINGERS IV SOLN: 500.0000 mL | INTRAVENOUS | Status: DC | PRN

## 2019-12-22 MED ORDER — BUTORPHANOL TARTRATE 1 MG/ML IJ SOLN: 1.0000 mg | INTRAMUSCULAR | Status: DC | PRN

## 2019-12-22 MED ORDER — LIDOCAINE HCL (PF) 1 % IJ SOLN: 30.0000 mL | INTRAMUSCULAR | Status: DC | PRN

## 2019-12-22 MED ORDER — SODIUM CHLORIDE 0.9 % IV SOLN
INTRAVENOUS | Status: AC
  Filled 2019-12-22: qty 2000

## 2019-12-22 MED ORDER — BENZOCAINE-MENTHOL 20-0.5 % EX AERO
1.0000 "application " | INHALATION_SPRAY | CUTANEOUS | Status: DC | PRN
  Administered 2019-12-22: 1 via TOPICAL
  Filled 2019-12-22 (×2): qty 56

## 2019-12-22 MED ORDER — ACETAMINOPHEN 325 MG PO TABS: 650.0000 mg | ORAL_TABLET | ORAL | Status: DC | PRN

## 2019-12-22 MED ORDER — IBUPROFEN 600 MG PO TABS
ORAL_TABLET | ORAL | Status: AC
  Filled 2019-12-22: qty 1

## 2019-12-22 MED ORDER — PRENATAL MULTIVITAMIN CH
1.0000 | ORAL_TABLET | Freq: Every day | ORAL | Status: DC
  Administered 2019-12-22 – 2019-12-23 (×2): 1 via ORAL
  Filled 2019-12-22 (×2): qty 1

## 2019-12-22 MED ORDER — ONDANSETRON HCL 4 MG/2ML IJ SOLN: 4.0000 mg | INTRAMUSCULAR | Status: DC | PRN

## 2019-12-22 MED ORDER — LIDOCAINE HCL (PF) 1 % IJ SOLN
INTRAMUSCULAR | Status: AC
  Filled 2019-12-22: qty 30

## 2019-12-22 MED ORDER — ZOLPIDEM TARTRATE 5 MG PO TABS: 5.0000 mg | ORAL_TABLET | Freq: Every evening | ORAL | Status: DC | PRN

## 2019-12-22 MED ORDER — SENNOSIDES-DOCUSATE SODIUM 8.6-50 MG PO TABS
2.0000 | ORAL_TABLET | ORAL | Status: DC
  Administered 2019-12-24: 2 via ORAL
  Filled 2019-12-22 (×3): qty 2

## 2019-12-22 MED ORDER — DIBUCAINE (PERIANAL) 1 % EX OINT: 1.0000 "application " | TOPICAL_OINTMENT | CUTANEOUS | Status: DC | PRN

## 2019-12-22 MED ORDER — WITCH HAZEL-GLYCERIN EX PADS: 1.0000 "application " | MEDICATED_PAD | CUTANEOUS | Status: DC | PRN

## 2019-12-22 MED ORDER — OXYTOCIN BOLUS FROM INFUSION
333.0000 mL | Freq: Once | INTRAVENOUS | Status: AC
  Administered 2019-12-22: 333 mL via INTRAVENOUS

## 2019-12-22 MED ORDER — SIMETHICONE 80 MG PO CHEW: 80.0000 mg | CHEWABLE_TABLET | ORAL | Status: DC | PRN

## 2019-12-22 MED ORDER — SODIUM CHLORIDE 0.9 % IV SOLN: 5.0000 10*6.[IU] | Freq: Once | INTRAVENOUS | Status: DC

## 2019-12-22 NOTE — Lactation Note (Addendum)
This note was copied from a baby's chart. Lactation Consultation Note  Patient Name: Girl Nyliah Nierenberg VKPQA'E Date: 12/22/2019 Reason for consult: Initial assessment;Term   Maternal Data Formula Feeding for Exclusion: No Has patient been taught Hand Expression?: Yes Does the patient have breastfeeding experience prior to this delivery?: Yes States breastfed 1st x 6 mths, but had little milk supply, has hx of PCOS, scleroderma/Raynauds, had breast changes this time while pregnant, able to hand express drops Feeding Feeding Type: Breast Fed Baby latches easily to breast and nursing well without stimulation LATCH Score Latch: Grasps breast easily, tongue down, lips flanged, rhythmical sucking.  Audible Swallowing: None  Type of Nipple: Everted at rest and after stimulation  Comfort (Breast/Nipple): Soft / non-tender  Hold (Positioning): Assistance needed to correctly position infant at breast and maintain latch.  LATCH Score: 7  Interventions Interventions: Breast feeding basics reviewed;Assisted with latch;Hand express;Support pillows;Coconut oil  Lactation Tools Discussed/Used WIC Program: No LC name and no. Written on white board  Consult Status Consult Status: PRN    Dyann Kief 12/22/2019, 11:04 AM

## 2019-12-22 NOTE — Discharge Summary (Signed)
Obstetrical Discharge Summary  Patient Name: Angelica Valenzuela DOB: November 17, 1990 MRN: [id number]  Date of Admission: 12/22/2019 Date of Delivery: 12/22/19 Delivered By: Heloise Ochoa CNM Date of Discharge: 12/24/2019  Primary OB: [facility]   [id number]:[id number]'T last menstrual period was 03/14/2019. EDC Estimated Date of Delivery: 12/19/19 Gestational Age at Delivery: [redacted]w[redacted]d   Antepartum complications: 1. History of anxiety and depression in the past, no meds currently 2. 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 Iron deficiency anemia on iron supplementation 4. GBS Positive  Admitting Diagnosis:  Active labor Secondary Diagnosis: SVD  Patient Active Problem List   Diagnosis Date Noted  . NSVD (normal spontaneous vaginal delivery) 12/23/2019  . Acute blood loss anemia 12/23/2019    Augmentation: AROM Complications: None Intrapartum complications/course: arrived in active labor, see delivery note; Ampicillin given prior to delivery.  Date of Delivery: 12/22/19 Delivered By: 12/24/19 CNM Delivery Type: spontaneous vaginal delivery Analgesia:  nitrous Placenta: spontaneous Laceration: left labial, perineal abrasions Episiotomy: none Newborn Data: Live born female "Vanya" Birth Weight:   APGAR: 8, 9  Newborn Delivery   Birth date/time: 12/22/2019 06:30:00 Delivery type: Vaginal, Spontaneous       Postpartum Procedures: none  Edinburgh:  Edinburgh Postnatal Depression Scale Screening Tool 12/24/2019 12/22/2019  I have been able to laugh and see the funny side of things. 0 (No Data)  I have looked forward with enjoyment to things. 0 -  I have blamed myself unnecessarily when things went wrong. 0 -  I have been anxious or worried for no good reason. 0 -  I have felt scared or panicky for no good reason. 0 -  Things have been getting on top of me. 1 -  I have been so unhappy that I have had difficulty sleeping. 0 -  I  have felt sad or miserable. 0 -  I have been so unhappy that I have been crying. 0 -  The thought of harming myself has occurred to me. 0 -  Edinburgh Postnatal Depression Scale Total 1 -      Post partum course:  Patient had an uncomplicated postpartum course.  By time of discharge on PPD#2, her pain was controlled on oral pain medications; she had appropriate lochia and was ambulating, voiding without difficulty and tolerating regular diet.  She was deemed stable for discharge to home.    Discharge Physical Exam:  BP 107/74 (BP Location: Right Arm)   Pulse 71   Temp 98.5 F (36.9 C) (Oral)   Resp 20   Ht 5\' 5"  (1.651 m)   Wt 83.9 kg   LMP 03/14/2019   SpO2 98%   Breastfeeding Unknown   BMI 30.79 kg/m   General: NAD CV: RRR Pulm: CTABL, nl effort ABD: s/nd/nt, fundus firm and below the umbilicus Lochia: moderate Perineum: intact DVT Evaluation: LE non-ttp, no evidence of DVT on exam.  Hemoglobin  Date Value Ref Range Status  12/23/2019 9.6 (L) 12.0 - 15.0 g/dL Final   HGB  Date Value Ref Range Status  08/30/2011 13.3 12.0 - 16.0 g/dL Final   HCT  Date Value Ref Range Status  12/23/2019 29.4 (L) 36 - 46 % Final  08/31/2011 31.5 (L) 35.0 - 47.0 % Final     Disposition: stable, discharge to home. Baby Feeding: breastmilk Baby Disposition: home with mom  Rh Immune globulin given: n/a Rubella vaccine given: immune Varicella vaccine given: immune Tdap vaccine given in AP or PP setting: 10/12/19 Flu  vaccine given in AP or PP setting: 04/2019  Contraception: TBD  Prenatal Labs:  Blood type/Rh A+  Antibody screen neg  Rubella Immune  Varicella Immune  RPR NR  HBsAg Neg  HIV NR  GC neg  Chlamydia neg  Genetic screening cfDNA negative, AFP negative  1 hour GTT 94  3 hour GTT n/a  GBS positive      Plan:  Angelica Valenzuela was discharged to home in good condition. Follow-up appointment with delivering provider in 6 weeks.  Discharge  Medications: Allergies as of 12/24/2019   No Known Allergies     Medication List    TAKE these medications   hydroxychloroquine 200 MG tablet Commonly known as: PLAQUENIL Take 200 mg by mouth.   prenatal multivitamin Tabs tablet Take 1 tablet by mouth daily at 12 noon.   Vasculera Tabs Take 600 mg by mouth daily.        Follow-up Information    McVey, Prudencio Pair, CNM. Schedule an appointment as soon as possible for a visit in 2 week(s).   Specialty: Obstetrics and Gynecology Why: for a mood check Contact information: [address] [place] [address] [phone]               Signed:  Cyril Mourning, CNM 12/24/2019  12:37 PM

## 2019-12-22 NOTE — H&P (Signed)
OB History & Physical   History of Present Illness:  Chief Complaint:   HPI:  Angelica Valenzuela is a 29 y.o. G92P1001 female at [redacted]w[redacted]d dated by LMP and c/w [redacted]w[redacted]d [place].  She presents to L&D for painful contractions. Reports active FM, onset of ctx @ 1800 last night,  currently every 2-3 minutes, denies LOF, having bloody show.     Pregnancy Issues: 1. History of anxiety and depression in the past, no meds currently 2. 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 Iron deficiency anemia on iron supplementation   Maternal Medical History:   Past Medical History:  Diagnosis Date  . Anxiety   . Depression   . Hx of scleroderma   . Insomnia   . Polycystic disease, ovaries   . Raynaud's disease     Past Surgical History:  Procedure Laterality Date  . ANKLE FRACTURE SURGERY Left   . ESOPHAGOGASTRODUODENOSCOPY (EGD) WITH PROPOFOL N/A 03/04/2018   Procedure: ESOPHAGOGASTRODUODENOSCOPY (EGD) WITH PROPOFOL;  Surgeon: 03/06/2018, MD;  Location: [facility];  Service: Gastroenterology;  Laterality: N/A;    No Known Allergies  Prior to Admission medications   Medication Sig Start Date End Date Taking? Authorizing Provider  Dietary Management Product (VASCULERA) TABS Take 600 mg by mouth daily.    [provider]  hydroxychloroquine (PLAQUENIL) 200 MG tablet Take 200 mg by mouth.    [provider]  Prenatal Vit-Fe Fumarate-FA (PRENATAL MULTIVITAMIN) TABS tablet Take 1 tablet by mouth daily at 12 noon.    [provider]     Prenatal care site: [facility]    Social History: She  reports that she has never smoked. She has never used smokeless tobacco. She reports previous alcohol use. She reports that she does not use drugs.  Family History: family history includes Diabetes in her father; Hypertension in her sister.   Review of Systems: A full review of systems was performed and negative except as noted in the  HPI.     Physical Exam:  Vital Signs: LMP 03/14/2019  General: no acute distress.  HEENT: normocephalic, atraumatic Heart: regular rate & rhythm.  No murmurs/rubs/gallops Lungs: clear to auscultation bilaterally, normal respiratory effort Abdomen: soft, gravid, non-tender;  EFW: 7lbs Pelvic:   External: Normal external female genitalia  Cervix: 9.5cm/+1 with BBOW per RN exam   Extremities: non-tender, symmetric, no edema bilaterally.  DTRs: 2+  Neurologic: Alert & oriented x 3.    No results found for this or any previous visit (from the past 24 hour(s)).  Pertinent Results:  Prenatal Labs: Blood type/Rh  A pos  Antibody screen neg  Rubella Immune  Varicella Immune  RPR NR  HBsAg Neg  HIV NR  GC neg  Chlamydia neg  Genetic screening  cfDNA negative, AFP negative  1 hour GTT  94  3 hour GTT  n/a  GBS  Pos   FHT: 150bpm, moderate variability, no accels, variable/early decel noted x 1- intermittent tracing at times due to maternal movement r/t pain.  TOCO: q2-26min SVE: per RN exam 9.5cm with BBOW   Cephalic by leopolds and exam  No results found.  Assessment:  Angelica Valenzuela is a 29 y.o. G41P1001 female at [redacted]w[redacted]d with active labor at term.   Plan:  1. Admit to Labor & Delivery; consents reviewed and obtained - COVID swab on admit  2. Fetal Well being  - Fetal Tracing: Cat II - monitor closely - Group B Streptococcus ppx indicated:  Positive, Ampicillin initiated.  - Presentation: cephalic confirmed by exam   3. Routine OB: - Prenatal labs reviewed, as above - Rh A POs - CBC, T&S, RPR on admit - Clear fluids, IVF  4. Monitoring of Labor -  Contractions: external toco in place -  Pelvis proven to 7lbs -  Plan for expectatnt mgmt -  Plan for continuous fetal monitoring  -  Maternal pain control as desired; requesting nitrous or regional anesthesia, discussed advanced stage of labor and unlikely to get epidural.  - Anticipate vaginal delivery  5. Post  Partum Planning: - Infant feeding: breast - Contraception: TBD  Randa Ngo, CNM 12/22/19 5:57 AM

## 2019-12-23 DIAGNOSIS — D62 Acute posthemorrhagic anemia: Secondary | ICD-10-CM | POA: Diagnosis not present

## 2019-12-23 LAB — CBC
HCT: 29.4 % — ABNORMAL LOW (ref 36.0–46.0)
Hemoglobin: 9.6 g/dL — ABNORMAL LOW (ref 12.0–15.0)
MCH: 25.7 pg — ABNORMAL LOW (ref 26.0–34.0)
MCHC: 32.7 g/dL (ref 30.0–36.0)
MCV: 78.6 fL — ABNORMAL LOW (ref 80.0–100.0)
Platelets: 235 10*3/uL (ref 150–400)
RBC: 3.74 MIL/uL — ABNORMAL LOW (ref 3.87–5.11)
RDW: 13.1 % (ref 11.5–15.5)
WBC: 11.5 10*3/uL — ABNORMAL HIGH (ref 4.0–10.5)
nRBC: 0 % (ref 0.0–0.2)

## 2019-12-23 MED ORDER — IBUPROFEN 600 MG PO TABS
600.0000 mg | ORAL_TABLET | Freq: Four times a day (QID) | ORAL | Status: DC
  Administered 2019-12-23 – 2019-12-24 (×3): 600 mg via ORAL
  Filled 2019-12-23 (×4): qty 1

## 2019-12-23 MED ORDER — FERROUS SULFATE 325 (65 FE) MG PO TABS
325.0000 mg | ORAL_TABLET | Freq: Every day | ORAL | Status: DC
  Administered 2019-12-23 – 2019-12-24 (×2): 325 mg via ORAL
  Filled 2019-12-23 (×2): qty 1

## 2019-12-23 NOTE — Progress Notes (Signed)
Post Partum Day 1  Subjective: Doing well, no concerns. Ambulating without difficulty, pain managed with PO meds, tolerating regular diet, and voiding without difficulty.   No fever/chills, chest pain, shortness of breath, nausea/vomiting, or leg pain. No nipple or breast pain.   Objective: BP 105/68   Pulse 67   Temp 97.7 F (36.5 C) (Oral)   Resp 20   Ht 5\' 5"  (1.651 m)   Wt 83.9 kg   LMP 03/14/2019   SpO2 98%   Breastfeeding Unknown   BMI 30.79 kg/m    Physical Exam:  General: alert and cooperative Breasts: soft/nontender CV: RRR Pulm: nl effort Abdomen: soft, non-tender Uterine Fundus: firm Incision: n/a Perineum: minimal edema Lochia: appropriate DVT Evaluation: No evidence of DVT seen on physical exam. Edinburgh:  Edinburgh Postnatal Depression Scale Screening Tool 12/22/2019  I have been able to laugh and see the funny side of things. (No Data)     Recent Labs    12/22/19 0720 12/23/19 0505  HGB 10.3* 9.6*  HCT 31.2* 29.4*  WBC 12.5* 11.5*  PLT 255 235    Assessment/Plan: 29 y.o. 12/25/19 postpartum day # 1  -Continue routine postpartum care -Lactation consult PRN for breastfeeding   -Acute blood loss anemia - hemodynamically stable and asymptomatic; start PO ferrous sulfate QD with stool softeners  -Immunization status:  all immunizations up to date  Disposition: Continue inpatient postpartum care Plan discharge home tomorrow due to GBS pos status and inadequate treatment   LOS: 1 day   Caydin Yeatts, CNM 12/23/2019, 8:20 AM

## 2019-12-23 NOTE — Lactation Note (Signed)
This note was copied from a baby's chart. Lactation Consultation Note  Patient Name: Angelica Valenzuela ZYSAY'T Date: 12/23/2019   Assisted mom with breast feeding.  Demonstrated how to hand express colostrum from both breasts with more from left than right.  Mom does not have a lot of breast tissue with even less on right.  Symphony has been set up in room for stimulation.  Mom has not been able to express any milk with manual pump or electric pump.  Mom has history of Raynauds with fingers and toes being affected.  No discoloration, vasospasms or pain noted to nipples when hand express or during or after breast feeding.  Mom has history of PCOS.  Mom reports breast feeding first for 6 months but never having enough milk.  She reports having lots of latch issues with first, but Angelica Valenzuela is latching well.  She latches with minimal assistance and has strong, rhythmic suck with occasional swallows.  Mom does not have WIC but does have [employer] but has not gotten pump.  Information given on process of how to get DEBP through [employer].  Explained feeding cues and encouraged mom to put Angelica Valenzuela to the breast whenever she demonstrated hunger cues.  Reviewed normal newborn stomach size, supply and demand, adequate intake and output, normal course of lactation and routine newborn feeding patterns.  Lactation community resource hand out given and reviewed.  Lactation name and number written on white board and encouraged to call with any questions, concerns or assistance.  Maternal Data    Feeding    LATCH Score                   Interventions    Lactation Tools Discussed/Used     Consult Status      Angelica Valenzuela 12/23/2019, 7:58 PM

## 2019-12-23 NOTE — Discharge Instructions (Signed)
Postpartum Care After Vaginal Delivery This sheet gives you information about how to care for yourself from the time you deliver your baby to up to 6-12 weeks after delivery (postpartum period). Your health care provider may also give you more specific instructions. If you have problems or questions, contact your health care provider. Follow these instructions at home: Vaginal bleeding  It is normal to have vaginal bleeding (lochia) after delivery. Wear a sanitary pad for vaginal bleeding and discharge. ? During the first week after delivery, the amount and appearance of lochia is often similar to a menstrual period. ? Over the next few weeks, it will gradually decrease to a dry, yellow-brown discharge. ? For most women, lochia stops completely by 4-6 weeks after delivery. Vaginal bleeding can vary from woman to woman.  Change your sanitary pads frequently. Watch for any changes in your flow, such as: ? A sudden increase in volume. ? A change in color. ? Large blood clots.  If you pass a blood clot from your vagina, save it and call your health care provider to discuss. Do not flush blood clots down the toilet before talking with your health care provider.  Do not use tampons or douches until your health care provider says this is safe.  If you are not breastfeeding, your period should return 6-8 weeks after delivery. If you are feeding your child breast milk only (exclusive breastfeeding), your period may not return until you stop breastfeeding. Perineal care  Keep the area between the vagina and the anus (perineum) clean and dry as told by your health care provider. Use medicated pads and pain-relieving sprays and creams as directed.  If you had a cut in the perineum (episiotomy) or a tear in the vagina, check the area for signs of infection until you are healed. Check for: ? More redness, swelling, or pain. ? Fluid or blood coming from the cut or tear. ? Warmth. ? Pus or a bad  smell.  You may be given a squirt bottle to use instead of wiping to clean the perineum area after you go to the bathroom. As you start healing, you may use the squirt bottle before wiping yourself. Make sure to wipe gently.  To relieve pain caused by an episiotomy, a tear in the vagina, or swollen veins in the anus (hemorrhoids), try taking a warm sitz bath 2-3 times a day. A sitz bath is a warm water bath that is taken while you are sitting down. The water should only come up to your hips and should cover your buttocks. Breast care  Within the first few days after delivery, your breasts may feel heavy, full, and uncomfortable (breast engorgement). Milk may also leak from your breasts. Your health care provider can suggest ways to help relieve the discomfort. Breast engorgement should go away within a few days.  If you are breastfeeding: ? Wear a bra that supports your breasts and fits you well. ? Keep your nipples clean and dry. Apply creams and ointments as told by your health care provider. ? You may need to use breast pads to absorb milk that leaks from your breasts. ? You may have uterine contractions every time you breastfeed for up to several weeks after delivery. Uterine contractions help your uterus return to its normal size. ? If you have any problems with breastfeeding, work with your health care provider or lactation consultant.  If you are not breastfeeding: ? Avoid touching your breasts a lot. Doing this can make   your breasts produce more milk. ? Wear a good-fitting bra and use cold packs to help with swelling. ? Do not squeeze out (express) milk. This causes you to make more milk. Intimacy and sexuality  Ask your health care provider when you can engage in sexual activity. This may depend on: ? Your risk of infection. ? How fast you are healing. ? Your comfort and desire to engage in sexual activity.  You are able to get pregnant after delivery, even if you have not had  your period. If desired, talk with your health care provider about methods of birth control (contraception). Medicines  Take over-the-counter and prescription medicines only as told by your health care provider.  If you were prescribed an antibiotic medicine, take it as told by your health care provider. Do not stop taking the antibiotic even if you start to feel better. Activity  Gradually return to your normal activities as told by your health care provider. Ask your health care provider what activities are safe for you.  Rest as much as possible. Try to rest or take a nap while your baby is sleeping. Eating and drinking   Drink enough fluid to keep your urine pale yellow.  Eat high-fiber foods every day. These may help prevent or relieve constipation. High-fiber foods include: ? Whole grain cereals and breads. ? Brown rice. ? Beans. ? Fresh fruits and vegetables.  Do not try to lose weight quickly by cutting back on calories.  Take your prenatal vitamins until your postpartum checkup or until your health care provider tells you it is okay to stop. Lifestyle  Do not use any products that contain nicotine or tobacco, such as cigarettes and e-cigarettes. If you need help quitting, ask your health care provider.  Do not drink alcohol, especially if you are breastfeeding. General instructions  Keep all follow-up visits for you and your baby as told by your health care provider. Most women visit their health care provider for a postpartum checkup within the first 3-6 weeks after delivery. Contact a health care provider if:  You feel unable to cope with the changes that your child brings to your life, and these feelings do not go away.  You feel unusually sad or worried.  Your breasts become red, painful, or hard.  You have a fever.  You have trouble holding urine or keeping urine from leaking.  You have little or no interest in activities you used to enjoy.  You have not  breastfed at all and you have not had a menstrual period for 12 weeks after delivery.  You have stopped breastfeeding and you have not had a menstrual period for 12 weeks after you stopped breastfeeding.  You have questions about caring for yourself or your baby.  You pass a blood clot from your vagina. Get help right away if:  You have chest pain.  You have difficulty breathing.  You have sudden, severe leg pain.  You have severe pain or cramping in your lower abdomen.  You bleed from your vagina so much that you fill more than one sanitary pad in one hour. Bleeding should not be heavier than your heaviest period.  You develop a severe headache.  You faint.  You have blurred vision or spots in your vision.  You have bad-smelling vaginal discharge.  You have thoughts about hurting yourself or your baby. If you ever feel like you may hurt yourself or others, or have thoughts about taking your own life, get help  right away. You can go to the nearest emergency department or call:  Your local emergency services (911 in the [place]  A suicide crisis helpline, such as the National Suicide Prevention Lifeline at (214)286-3430. This is open 24 hours a day. Summary  The period of time right after you deliver your newborn up to 6-12 weeks after delivery is called the postpartum period.  Gradually return to your normal activities as told by your health care provider.  Keep all follow-up visits for you and your baby as told by your health care provider. This information is not intended to replace advice given to you by your health care provider. Make sure you discuss any questions you have with your health care provider. Document Revised: 05/28/2017 Document Reviewed: 03/08/2017 Elsevier Patient Education  2020 [employer].    Breastfeeding  Choosing to breastfeed is one of the best decisions you can make for yourself and your baby. A change in hormones during pregnancy causes  your breasts to make breast milk in your milk-producing glands. Hormones prevent breast milk from being released before your baby is born. They also prompt milk flow after birth. Once breastfeeding has begun, thoughts of your baby, as well as his or her sucking or crying, can stimulate the release of milk from your milk-producing glands. Benefits of breastfeeding Research shows that breastfeeding offers many health benefits for infants and mothers. It also offers a cost-free and convenient way to feed your baby. For your baby  Your first milk (colostrum) helps your baby's digestive system to function better.  Special cells in your milk (antibodies) help your baby to fight off infections.  Breastfed babies are less likely to develop asthma, allergies, obesity, or type 2 diabetes. They are also at lower risk for sudden infant death syndrome (SIDS).  Nutrients in breast milk are better able to meet your baby's needs compared to infant formula.  Breast milk improves your baby's brain development. For you  Breastfeeding helps to create a very special bond between you and your baby.  Breastfeeding is convenient. Breast milk costs nothing and is always available at the correct temperature.  Breastfeeding helps to burn calories. It helps you to lose the weight that you gained during pregnancy.  Breastfeeding makes your uterus return faster to its size before pregnancy. It also slows bleeding (lochia) after you give birth.  Breastfeeding helps to lower your risk of developing type 2 diabetes, osteoporosis, rheumatoid arthritis, cardiovascular disease, and breast, ovarian, uterine, and endometrial cancer later in life. Breastfeeding basics Starting breastfeeding  Find a comfortable place to sit or lie down, with your neck and back well-supported.  Place a pillow or a rolled-up blanket under your baby to bring him or her to the level of your breast (if you are seated). Nursing pillows are  specially designed to help support your arms and your baby while you breastfeed.  Make sure that your baby's tummy (abdomen) is facing your abdomen.  Gently massage your breast. With your fingertips, massage from the outer edges of your breast inward toward the nipple. This encourages milk flow. If your milk flows slowly, you may need to continue this action during the feeding.  Support your breast with 4 fingers underneath and your thumb above your nipple (make the letter "C" with your hand). Make sure your fingers are well away from your nipple and your baby's mouth.  Stroke your baby's lips gently with your finger or nipple.  When your baby's mouth is open wide enough,  quickly bring your baby to your breast, placing your entire nipple and as much of the areola as possible into your baby's mouth. The areola is the colored area around your nipple. ? More areola should be visible above your baby's upper lip than below the lower lip. ? Your baby's lips should be opened and extended outward (flanged) to ensure an adequate, comfortable latch. ? Your baby's tongue should be between his or her lower gum and your breast.  Make sure that your baby's mouth is correctly positioned around your nipple (latched). Your baby's lips should create a seal on your breast and be turned out (everted).  It is common for your baby to suck about 2-3 minutes in order to start the flow of breast milk. Latching Teaching your baby how to latch onto your breast properly is very important. An improper latch can cause nipple pain, decreased milk supply, and poor weight gain in your baby. Also, if your baby is not latched onto your nipple properly, he or she may swallow some air during feeding. This can make your baby fussy. Burping your baby when you switch breasts during the feeding can help to get rid of the air. However, teaching your baby to latch on properly is still the best way to prevent fussiness from swallowing air  while breastfeeding. Signs that your baby has successfully latched onto your nipple  Silent tugging or silent sucking, without causing you pain. Infant's lips should be extended outward (flanged).  Swallowing heard between every 3-4 sucks once your milk has started to flow (after your let-down milk reflex occurs).  Muscle movement above and in front of his or her ears while sucking. Signs that your baby has not successfully latched onto your nipple  Sucking sounds or smacking sounds from your baby while breastfeeding.  Nipple pain. If you think your baby has not latched on correctly, slip your finger into the corner of your baby's mouth to break the suction and place it between your baby's gums. Attempt to start breastfeeding again. Signs of successful breastfeeding Signs from your baby  Your baby will gradually decrease the number of sucks or will completely stop sucking.  Your baby will fall asleep.  Your baby's body will relax.  Your baby will retain a small amount of milk in his or her mouth.  Your baby will let go of your breast by himself or herself. Signs from you  Breasts that have increased in firmness, weight, and size 1-3 hours after feeding.  Breasts that are softer immediately after breastfeeding.  Increased milk volume, as well as a change in milk consistency and color by the fifth day of breastfeeding.  Nipples that are not sore, cracked, or bleeding. Signs that your baby is getting enough milk  Wetting at least 1-2 diapers during the first 24 hours after birth.  Wetting at least 5-6 diapers every 24 hours for the first week after birth. The urine should be clear or pale yellow by the age of 5 days.  Wetting 6-8 diapers every 24 hours as your baby continues to grow and develop.  At least 3 stools in a 24-hour period by the age of 5 days. The stool should be soft and yellow.  At least 3 stools in a 24-hour period by the age of 7 days. The stool should be  seedy and yellow.  No loss of weight greater than 10% of birth weight during the first 3 days of life.  Average weight gain of 4-7  oz (113-198 g) per week after the age of 4 days.  Consistent daily weight gain by the age of 5 days, without weight loss after the age of 2 weeks. After a feeding, your baby may spit up a small amount of milk. This is normal. Breastfeeding frequency and duration Frequent feeding will help you make more milk and can prevent sore nipples and extremely full breasts (breast engorgement). Breastfeed when you feel the need to reduce the fullness of your breasts or when your baby shows signs of hunger. This is called "breastfeeding on demand." Signs that your baby is hungry include:  Increased alertness, activity, or restlessness.  Movement of the head from side to side.  Opening of the mouth when the corner of the mouth or cheek is stroked (rooting).  Increased sucking sounds, smacking lips, cooing, sighing, or squeaking.  Hand-to-mouth movements and sucking on fingers or hands.  Fussing or crying. Avoid introducing a pacifier to your baby in the first 4-6 weeks after your baby is born. After this time, you may choose to use a pacifier. Research has shown that pacifier use during the first year of a baby's life decreases the risk of sudden infant death syndrome (SIDS). Allow your baby to feed on each breast as long as he or she wants. When your baby unlatches or falls asleep while feeding from the first breast, offer the second breast. Because newborns are often sleepy in the first few weeks of life, you may need to awaken your baby to get him or her to feed. Breastfeeding times will vary from baby to baby. However, the following rules can serve as a guide to help you make sure that your baby is properly fed:  Newborns (babies 60 weeks of age or younger) may breastfeed every 1-3 hours.  Newborns should not go without breastfeeding for longer than 3 hours during the  day or 5 hours during the night.  You should breastfeed your baby a minimum of 8 times in a 24-hour period. Breast milk pumping     Pumping and storing breast milk allows you to make sure that your baby is exclusively fed your breast milk, even at times when you are unable to breastfeed. This is especially important if you go back to work while you are still breastfeeding, or if you are not able to be present during feedings. Your lactation consultant can help you find a method of pumping that works best for you and give you guidelines about how long it is safe to store breast milk. Caring for your breasts while you breastfeed Nipples can become dry, cracked, and sore while breastfeeding. The following recommendations can help keep your breasts moisturized and healthy:  Avoid using soap on your nipples.  Wear a supportive bra designed especially for nursing. Avoid wearing underwire-style bras or extremely tight bras (sports bras).  Air-dry your nipples for 3-4 minutes after each feeding.  Use only cotton bra pads to absorb leaked breast milk. Leaking of breast milk between feedings is normal.  Use lanolin on your nipples after breastfeeding. Lanolin helps to maintain your skin's normal moisture barrier. Pure lanolin is not harmful (not toxic) to your baby. You may also hand express a few drops of breast milk and gently massage that milk into your nipples and allow the milk to air-dry. In the first few weeks after giving birth, some women experience breast engorgement. Engorgement can make your breasts feel heavy, warm, and tender to the touch. Engorgement peaks within 3-5  days after you give birth. The following recommendations can help to ease engorgement:  Completely empty your breasts while breastfeeding or pumping. You may want to start by applying warm, moist heat (in the shower or with warm, water-soaked hand towels) just before feeding or pumping. This increases circulation and helps the  milk flow. If your baby does not completely empty your breasts while breastfeeding, pump any extra milk after he or she is finished.  Apply ice packs to your breasts immediately after breastfeeding or pumping, unless this is too uncomfortable for you. To do this: ? Put ice in a plastic bag. ? Place a towel between your skin and the bag. ? Leave the ice on for 20 minutes, 2-3 times a day.  Make sure that your baby is latched on and positioned properly while breastfeeding. If engorgement persists after 48 hours of following these recommendations, contact your health care provider or a Advertising copywriter. Overall health care recommendations while breastfeeding  Eat 3 healthy meals and 3 snacks every day. Well-nourished mothers who are breastfeeding need an additional 450-500 calories a day. You can meet this requirement by increasing the amount of a balanced diet that you eat.  Drink enough water to keep your urine pale yellow or clear.  Rest often, relax, and continue to take your prenatal vitamins to prevent fatigue, stress, and low vitamin and mineral levels in your body (nutrient deficiencies).  Do not use any products that contain nicotine or tobacco, such as cigarettes and e-cigarettes. Your baby may be harmed by chemicals from cigarettes that pass into breast milk and exposure to secondhand smoke. If you need help quitting, ask your health care provider.  Avoid alcohol.  Do not use illegal drugs or marijuana.  Talk with your health care provider before taking any medicines. These include over-the-counter and prescription medicines as well as vitamins and herbal supplements. Some medicines that may be harmful to your baby can pass through breast milk.  It is possible to become pregnant while breastfeeding. If birth control is desired, ask your health care provider about options that will be safe while breastfeeding your baby. Where to find more information: [employer]  International: www.llli.org Contact a health care provider if:  You feel like you want to stop breastfeeding or have become frustrated with breastfeeding.  Your nipples are cracked or bleeding.  Your breasts are red, tender, or warm.  You have: ? Painful breasts or nipples. ? A swollen area on either breast. ? A fever or chills. ? Nausea or vomiting. ? Drainage other than breast milk from your nipples.  Your breasts do not become full before feedings by the fifth day after you give birth.  You feel sad and depressed.  Your baby is: ? Too sleepy to eat well. ? Having trouble sleeping. ? More than 60 week old and wetting fewer than 6 diapers in a 24-hour period. ? Not gaining weight by 49 days of age.  Your baby has fewer than 3 stools in a 24-hour period.  Your baby's skin or the white parts of his or her eyes become yellow. Get help right away if:  Your baby is overly tired (lethargic) and does not want to wake up and feed.  Your baby develops an unexplained fever. Summary  Breastfeeding offers many health benefits for infant and mothers.  Try to breastfeed your infant when he or she shows early signs of hunger.  Gently tickle or stroke your baby's lips with your finger or  nipple to allow the baby to open his or her mouth. Bring the baby to your breast. Make sure that much of the areola is in your baby's mouth. Offer one side and burp the baby before you offer the other side.  Talk with your health care provider or lactation consultant if you have questions or you face problems as you breastfeed. This information is not intended to replace advice given to you by your health care provider. Make sure you discuss any questions you have with your health care provider. Document Revised: 08/19/2017 Document Reviewed: 06/26/2016 Elsevier Patient Education  2020 [employer]    Perinatal Depression When a woman feels excessive sadness, anger, or anxiety during pregnancy or  during the first 12 months after she gives birth, she has a condition called perinatal depression. Depression can interfere with work, school, relationships, and other everyday activities. If it is not managed properly, it can also cause problems in the mother and her baby. Sometimes, perinatal depression is left untreated because symptoms are thought to be normal mood swings during and right after pregnancy. If you have symptoms of depression, it is important to talk with your health care provider. What are the causes? The exact cause of this condition is not known. Hormonal changes during and after pregnancy may play a role in causing perinatal depression. What increases the risk? You are more likely to develop this condition if:  You have a personal or family history of depression, anxiety, or mood disorders.  You experience a stressful life event during pregnancy, such as the death of a loved one.  You have a lot of regular life stress.  You do not have support from family members or loved ones, or you are in an abusive relationship. What are the signs or symptoms? Symptoms of this condition include:  Feeling sad or hopeless.  Feelings of guilt.  Feeling irritable or overwhelmed.  Changes in your appetite.  Lack of energy or motivation.  Sleep problems.  Difficulty concentrating or completing tasks.  Loss of interest in hobbies or relationships.  Headaches or stomach problems that do not go away. How is this diagnosed? This condition is diagnosed based on a physical exam and mental evaluation. In some cases, your health care provider may use a depression screening tool. These tools include a list of questions that can help a health care provider diagnose depression. Your health care provider may refer you to a mental health expert who specializes in depression. How is this treated? This condition may be treated with:  Medicines. Your health care provider will only give you  medicines that have been proven safe for pregnancy and breastfeeding.  Talk therapy with a mental health professional to help change your patterns of thinking (cognitive behavioral therapy).  Support groups.  Brain stimulation or light therapies.  Stress reduction therapies, such as mindfulness. Follow these instructions at home: Lifestyle  Do not use any products that contain nicotine or tobacco, such as cigarettes and e-cigarettes. If you need help quitting, ask your health care provider.  Do not use alcohol when you are pregnant. After your baby is born, limit alcohol intake to no more than 1 drink a day. One drink equals 12 oz of beer, 5 oz of wine, or 1 oz of hard liquor.  Consider joining a support group for new mothers. Ask your health care provider for recommendations.  Take good care of yourself. Make sure you: ? Get plenty of sleep. If you are having trouble  sleeping, talk with your health care provider. ? Eat a healthy diet. This includes plenty of fruits and vegetables, whole grains, and lean proteins. ? Exercise regularly, as told by your health care provider. Ask your health care provider what exercises are safe for you. General instructions  Take over-the-counter and prescription medicines only as told by your health care provider.  Talk with your partner or family members about your feelings during pregnancy. Share any concerns or anxieties that you may have.  Ask for help with tasks or chores when you need it. Ask friends and family members to provide meals, watch your children, or help with cleaning.  Keep all follow-up visits as told by your health care provider. This is important. Contact a health care provider if:  You (or people close to you) notice that you have any symptoms of depression.  You have depression and your symptoms get worse.  You experience side effects from medicines, such as nausea or sleep problems. Get help right away if:  You feel  like hurting yourself, your baby, or someone else. If you ever feel like you may hurt yourself or others, or have thoughts about taking your own life, get help right away. You can go to your nearest emergency department or call:  Your local emergency services (911 in the [place]  A suicide crisis helpline, such as the National Suicide Prevention Lifeline at 667-073-6661. This is open 24 hours a day. Summary  Perinatal depression is when a woman feels excessive sadness, anger, or anxiety during pregnancy or during the first 12 months after she gives birth.  If perinatal depression is not treated, it can lead to health problems for the mother and her baby.  This condition is treated with medicines, talk therapy, stress reduction therapies, or a combination of two or more treatments.  Talk with your partner or family members about your feelings. Do not be afraid to ask for help. This information is not intended to replace advice given to you by your health care provider. Make sure you discuss any questions you have with your health care provider. Document Revised: 11/09/2018 Document Reviewed: 07/22/2016 Elsevier Patient Education  2020 [employer].

## 2019-12-24 NOTE — Progress Notes (Signed)
Discharge instructions complete. Patient verbalizes understanding of teaching. Patient discharged home via wheelchair at 1320.

## 2019-12-25 LAB — SURGICAL PATHOLOGY

## 2021-03-30 IMAGING — US US MFM OB COMP +14 WKS
1 series · 12 of 28 positions shown · non-contrast
Comparison: none

PATIENT INFO:

PERFORMED BY:
SERVICE(S) PROVIDED:
 ----------------------------------------------------------------------
INDICATIONS:
  30 weeks gestation of pregnancy
FETAL EVALUATION:
 Num Of Fetuses:         1
 Fetal Heart Rate(bpm):  143
 Cardiac Activity:       Present
 Fetal Lie:              Cephalic
 Presentation:           Vertex
 Placenta:               Posterior, no previa
 AFI Sum(cm)     %Tile       Largest Pocket(cm)
 12.97           37
 RUQ(cm)       RLQ(cm)       LUQ(cm)        LLQ(cm)
BIOMETRY:
 BPD:      78.2  mm     G. Age:  31w 3d         79  %    CI:        75.63   %    70 - 86
                                                         FL/HC:      20.5   %    19.2 -
 HC:      285.1  mm     G. Age:  31w 2d         53  %    HC/AC:      1.09        0.99 -
 AC:      262.5  mm     G. Age:  30w 2d         56  %    FL/BPD:     74.7   %    71 - 87
 FL:       58.4  mm     G. Age:  30w 4d         50  %    FL/AC:      22.2   %    20 - 24
 HUM:      51.9  mm     G. Age:  30w 2d         58  %
 CER:      36.7  mm     G. Age:  31w 5d         69  %
 CM:        4.3  mm
 Est. FW:    7486  gm      3 lb 9 oz     56  %
GESTATIONAL AGE:
 LMP:           30w 0d        Date:  03/13/19                 EDD:   12/18/19
 U/S Today:     30w 6d                                        EDD:   12/12/19
 Best:          30w 0d     Det. By:  LMP  (03/13/19)          EDD:   12/18/19
ANATOMY:
 Cranium:               Within Normal Limits   Aortic Arch:            Normal appearance
 Cavum:                 CSP visualized         Ductal Arch:            Not visualized
 Ventricles:            Normal appearance      Diaphragm:              Within Normal Limits
 Cerebellum:            Within Normal Limits   Stomach:                Seen
 Posterior Fossa:       Within Normal Limits   Abdomen:                Suboptimal
 Nuchal Fold:           Within Normal Limits   Abdominal Wall:         Suboptimal
 Face:                  Orbits visualized      Cord Vessels:           3 vessels
 Lips:                  Normal appearance      Kidneys:                Normal appearance
 Thoracic:              Within Normal Limits   Bladder:                Seen
 Heart:                 4-Chamber view         Spine:                  Normal appearance
                        appears normal
 RVOT:                  Normal appearance      Upper Extremities:      Visualized
 LVOT:                  Normal appearance      Lower Extremities:      Visualized

[Series 1: us mfm ob comp +14 wks · 0.25mm/px · 81 acquisitions, 12 frames shown]
[im 3/81]
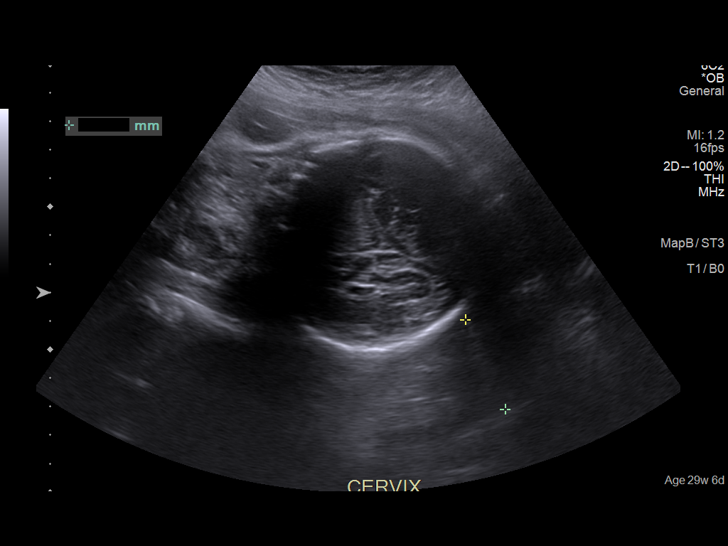
[im 9/81]
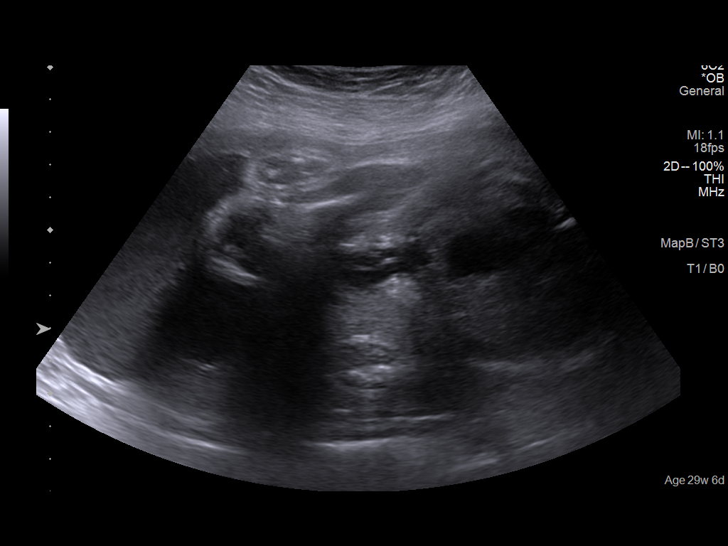
[im 15/81]
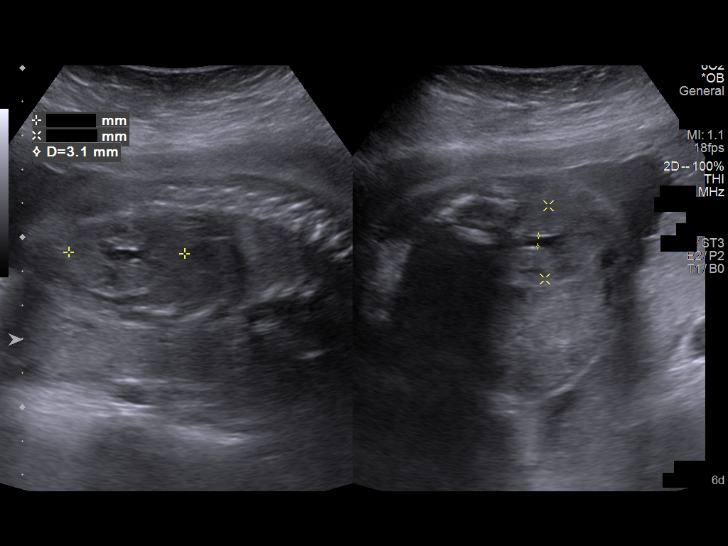
[im 24/81]
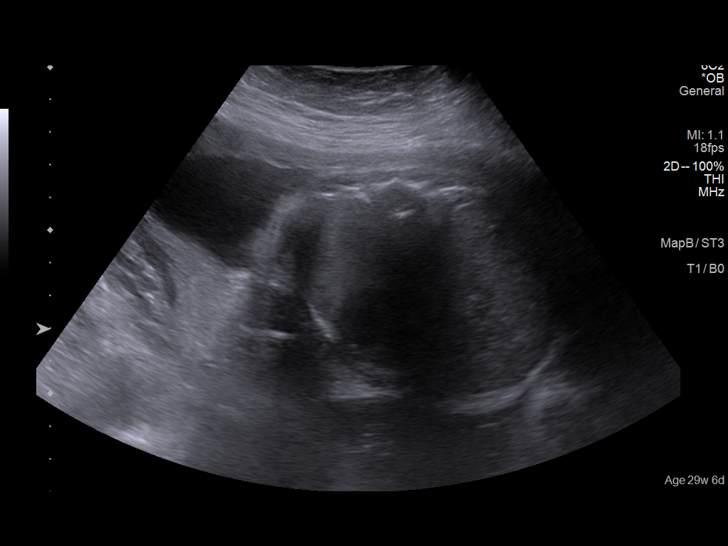
[im 30/81]
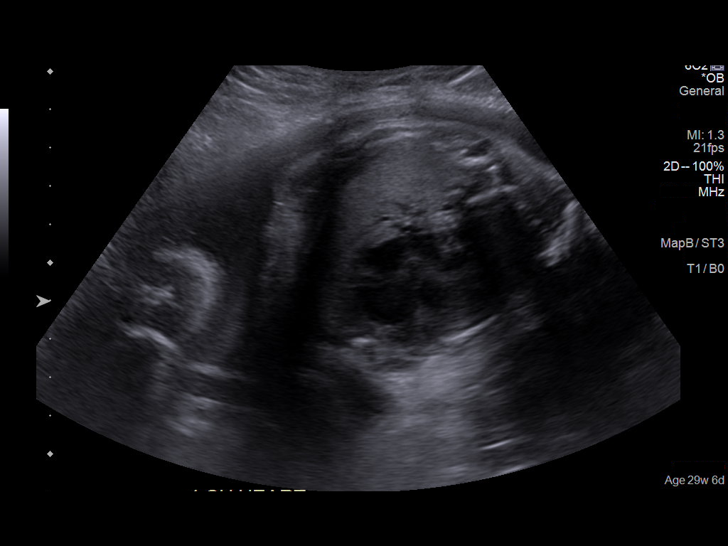
[im 36/81]
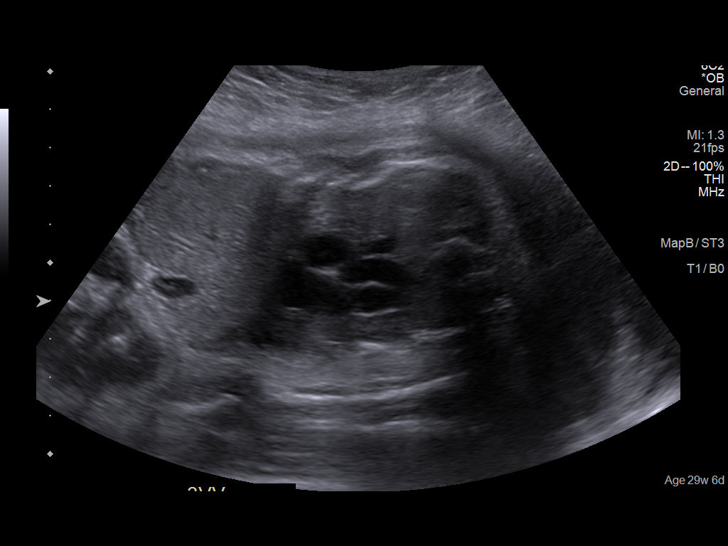
[im 45/81]
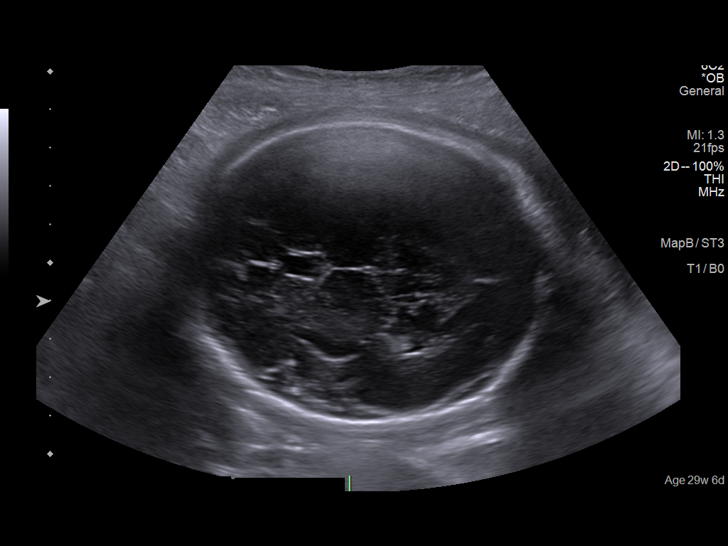
[im 51/81]
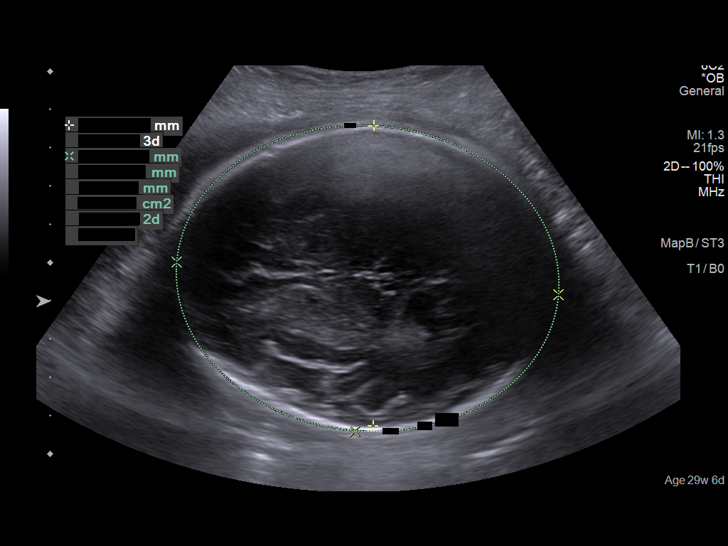
[im 57/81]
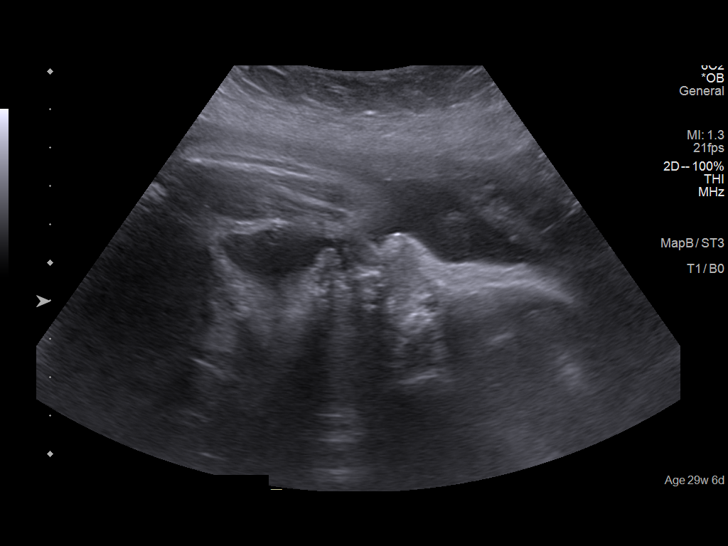
[im 66/81]
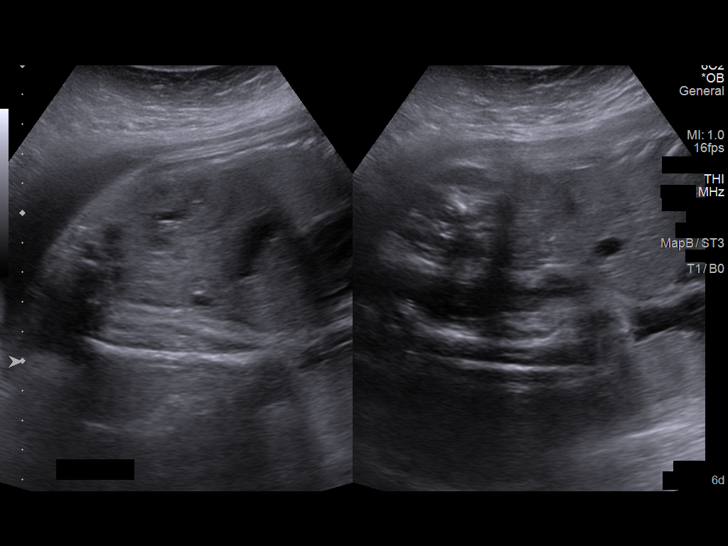
[im 72/81]
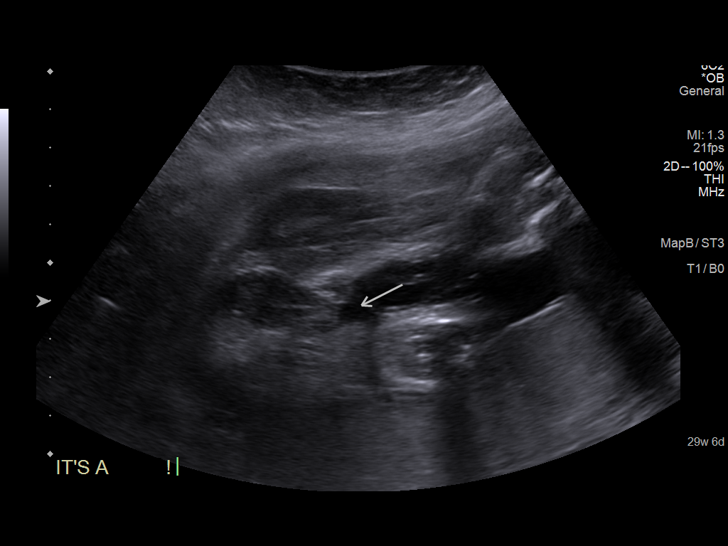
[im 78/81]
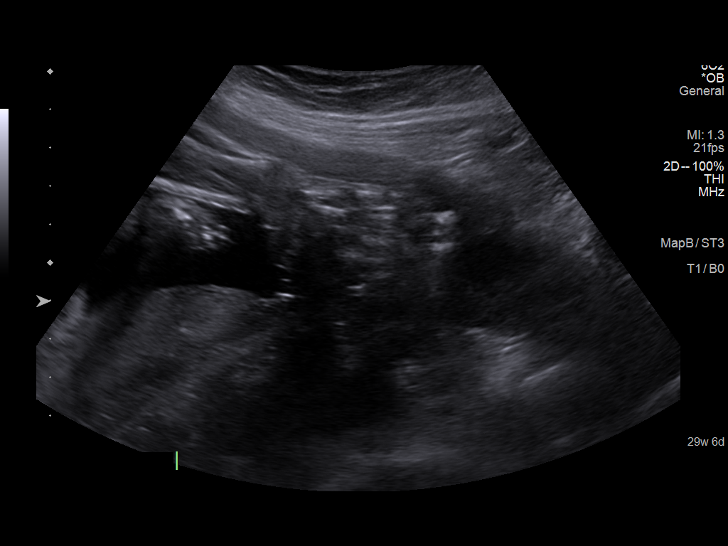

[12 of 28 positions shown; findings below may reference images not displayed]

IMPRESSION: Thank you for referring your patient  for a detailed fetal
 anatomical survey due to history of scleroderma.

 There is a singleton gestation at 30 weeks 6 days with
 subjectively normal amniotic fluid volume. Dating is by LMP
 consistent with earliest available ultrasound performed at
 [REDACTED] on 05/18/19 with measurements consistent
 with 9 weeks 2 days.  She had negative cffDNa screening.

 The fetal biometry correlates with established dating.

 Detailed evaluation of the fetal anatomy was performed.  Due
 to advanced gestational age, images of the abdominal cord
 insertion and ductal arch were suboptimally image. The
 remainder of the fetal anatomical survey appears within
 normal limits within the resolution of ultrasound as described
 above.

 A formal MFM consultation was performed today. Please see
 that note for details.

 Recommend serial growth every 4 weeks and antenatal
 testing weekly starting at 36 weeks, unless indicated sooner.

 Thank you for allowing us to participate in your patient's care.
 assistance.

                  Bobyphogat

## 2021-11-14 DIAGNOSIS — M0579 Rheumatoid arthritis with rheumatoid factor of multiple sites without organ or systems involvement: Secondary | ICD-10-CM | POA: Insufficient documentation

## 2021-12-01 DIAGNOSIS — O0990 Supervision of high risk pregnancy, unspecified, unspecified trimester: Secondary | ICD-10-CM | POA: Insufficient documentation

## 2022-01-06 ENCOUNTER — Other Ambulatory Visit: Payer: Self-pay | Admitting: Obstetrics and Gynecology

## 2022-01-06 DIAGNOSIS — M0579 Rheumatoid arthritis with rheumatoid factor of multiple sites without organ or systems involvement: Secondary | ICD-10-CM

## 2022-01-06 DIAGNOSIS — M349 Systemic sclerosis, unspecified: Secondary | ICD-10-CM

## 2022-01-08 ENCOUNTER — Ambulatory Visit: Payer: Self-pay | Attending: Obstetrics and Gynecology

## 2022-01-08 ENCOUNTER — Ambulatory Visit: Payer: Self-pay

## 2022-01-08 NOTE — Progress Notes (Deleted)
Pt was a "No Show" for her MFM appt today at [facility].

## 2022-01-13 ENCOUNTER — Other Ambulatory Visit: Payer: Self-pay

## 2022-02-06 ENCOUNTER — Ambulatory Visit: Admit: 2022-02-06 | Payer: Self-pay

## 2022-02-06 SURGERY — ESOPHAGOGASTRODUODENOSCOPY (EGD) WITH PROPOFOL
Anesthesia: General

## 2022-02-20 DIAGNOSIS — O09299 Supervision of pregnancy with other poor reproductive or obstetric history, unspecified trimester: Secondary | ICD-10-CM | POA: Insufficient documentation

## 2022-03-08 ENCOUNTER — Emergency Department: Payer: Managed Care, Other (non HMO)

## 2022-03-08 ENCOUNTER — Inpatient Hospital Stay
Admission: EM | Admit: 2022-03-08 | Discharge: 2022-03-12 | DRG: 831 | Disposition: A | Discharge status: Home / Self Care | Payer: Managed Care, Other (non HMO) | Attending: Obstetrics | Admitting: Obstetrics

## 2022-03-08 ENCOUNTER — Other Ambulatory Visit: Payer: Self-pay

## 2022-03-08 ENCOUNTER — Encounter: Payer: Self-pay | Admitting: Intensive Care

## 2022-03-08 DIAGNOSIS — N390 Urinary tract infection, site not specified: Secondary | ICD-10-CM | POA: Diagnosis present

## 2022-03-08 DIAGNOSIS — A419 Sepsis, unspecified organism: Secondary | ICD-10-CM | POA: Diagnosis present

## 2022-03-08 DIAGNOSIS — O99352 Diseases of the nervous system complicating pregnancy, second trimester: Secondary | ICD-10-CM | POA: Diagnosis present

## 2022-03-08 DIAGNOSIS — M349 Systemic sclerosis, unspecified: Secondary | ICD-10-CM | POA: Diagnosis present

## 2022-03-08 DIAGNOSIS — O98812 Other maternal infectious and parasitic diseases complicating pregnancy, second trimester: Secondary | ICD-10-CM | POA: Diagnosis present

## 2022-03-08 DIAGNOSIS — O99891 Other specified diseases and conditions complicating pregnancy: Secondary | ICD-10-CM | POA: Diagnosis present

## 2022-03-08 DIAGNOSIS — G43909 Migraine, unspecified, not intractable, without status migrainosus: Secondary | ICD-10-CM | POA: Diagnosis present

## 2022-03-08 DIAGNOSIS — N1330 Unspecified hydronephrosis: Secondary | ICD-10-CM | POA: Diagnosis present

## 2022-03-08 DIAGNOSIS — R000 Tachycardia, unspecified: Secondary | ICD-10-CM | POA: Diagnosis present

## 2022-03-08 DIAGNOSIS — O2342 Unspecified infection of urinary tract in pregnancy, second trimester: Secondary | ICD-10-CM | POA: Diagnosis present

## 2022-03-08 DIAGNOSIS — O99012 Anemia complicating pregnancy, second trimester: Secondary | ICD-10-CM | POA: Diagnosis present

## 2022-03-08 DIAGNOSIS — O99893 Other specified diseases and conditions complicating puerperium: Secondary | ICD-10-CM | POA: Diagnosis present

## 2022-03-08 DIAGNOSIS — O2302 Infections of kidney in pregnancy, second trimester: Secondary | ICD-10-CM | POA: Diagnosis present

## 2022-03-08 DIAGNOSIS — O26899 Other specified pregnancy related conditions, unspecified trimester: Secondary | ICD-10-CM

## 2022-03-08 DIAGNOSIS — N12 Tubulo-interstitial nephritis, not specified as acute or chronic: Principal | ICD-10-CM

## 2022-03-08 DIAGNOSIS — B9620 Unspecified Escherichia coli [E. coli] as the cause of diseases classified elsewhere: Secondary | ICD-10-CM | POA: Diagnosis present

## 2022-03-08 DIAGNOSIS — O2300 Infections of kidney in pregnancy, unspecified trimester: Secondary | ICD-10-CM | POA: Diagnosis present

## 2022-03-08 DIAGNOSIS — F419 Anxiety disorder, unspecified: Secondary | ICD-10-CM | POA: Insufficient documentation

## 2022-03-08 DIAGNOSIS — M069 Rheumatoid arthritis, unspecified: Secondary | ICD-10-CM | POA: Diagnosis present

## 2022-03-08 DIAGNOSIS — Z20822 Contact with and (suspected) exposure to covid-19: Secondary | ICD-10-CM | POA: Diagnosis present

## 2022-03-08 DIAGNOSIS — M359 Systemic involvement of connective tissue, unspecified: Secondary | ICD-10-CM | POA: Insufficient documentation

## 2022-03-08 DIAGNOSIS — Z3A20 20 weeks gestation of pregnancy: Secondary | ICD-10-CM | POA: Diagnosis not present

## 2022-03-08 DIAGNOSIS — D649 Anemia, unspecified: Secondary | ICD-10-CM | POA: Diagnosis not present

## 2022-03-08 LAB — URINALYSIS, ROUTINE W REFLEX MICROSCOPIC
Bilirubin Urine: NEGATIVE
Glucose, UA: NEGATIVE mg/dL
Hgb urine dipstick: NEGATIVE
Ketones, ur: 80 mg/dL — AB
Nitrite: NEGATIVE
Protein, ur: 100 mg/dL — AB
RBC / HPF: >50 RBC/hpf — ABNORMAL HIGH (ref 0–5)
Specific Gravity, Urine: 1.014 (ref 1.005–1.030)
WBC, UA: >50 WBC/hpf — ABNORMAL HIGH (ref 0–5)
pH: 5 (ref 5.0–8.0)

## 2022-03-08 LAB — RESP PANEL BY RT-PCR (FLU A&B, COVID) ARPGX2
Influenza A by PCR: NEGATIVE
Influenza B by PCR: NEGATIVE
SARS Coronavirus 2 by RT PCR: NEGATIVE

## 2022-03-08 LAB — CBC WITH DIFFERENTIAL/PLATELET
Abs Immature Granulocytes: 0.07 10*3/uL (ref 0.00–0.07)
Basophils Absolute: 0 10*3/uL (ref 0.0–0.1)
Basophils Relative: 0 %
Eosinophils Absolute: 0.1 10*3/uL (ref 0.0–0.5)
Eosinophils Relative: 0 %
HCT: 34.3 % — ABNORMAL LOW (ref 36.0–46.0)
Hemoglobin: 10.9 g/dL — ABNORMAL LOW (ref 12.0–15.0)
Immature Granulocytes: 0 %
Lymphocytes Relative: 6 %
Lymphs Abs: 1 10*3/uL (ref 0.7–4.0)
MCH: 26.9 pg (ref 26.0–34.0)
MCHC: 31.8 g/dL (ref 30.0–36.0)
MCV: 84.7 fL (ref 80.0–100.0)
Monocytes Absolute: 1 10*3/uL (ref 0.1–1.0)
Monocytes Relative: 6 %
Neutro Abs: 13.5 10*3/uL — ABNORMAL HIGH (ref 1.7–7.7)
Neutrophils Relative %: 88 %
Platelets: 268 10*3/uL (ref 150–400)
RBC: 4.05 MIL/uL (ref 3.87–5.11)
RDW: 15.3 % (ref 11.5–15.5)
WBC: 15.6 10*3/uL — ABNORMAL HIGH (ref 4.0–10.5)
nRBC: 0 % (ref 0.0–0.2)

## 2022-03-08 LAB — LACTIC ACID, PLASMA
Lactic Acid, Venous: 1 mmol/L (ref 0.5–1.9)
Lactic Acid, Venous: 1.8 mmol/L (ref 0.5–1.9)

## 2022-03-08 LAB — COMPREHENSIVE METABOLIC PANEL
ALT: 32 U/L (ref 0–44)
AST: 26 U/L (ref 15–41)
Albumin: 3 g/dL — ABNORMAL LOW (ref 3.5–5.0)
Alkaline Phosphatase: 78 U/L (ref 38–126)
Anion gap: 7 (ref 5–15)
BUN: 6 mg/dL (ref 6–20)
CO2: 20 mmol/L — ABNORMAL LOW (ref 22–32)
Calcium: 8 mg/dL — ABNORMAL LOW (ref 8.9–10.3)
Chloride: 105 mmol/L (ref 98–111)
Creatinine, Ser: 0.53 mg/dL (ref 0.44–1.00)
GFR, Estimated: >60 mL/min (ref >60)
Glucose, Bld: 105 mg/dL — ABNORMAL HIGH (ref 70–99)
Potassium: 3.5 mmol/L (ref 3.5–5.1)
Sodium: 132 mmol/L — ABNORMAL LOW (ref 135–145)
Total Bilirubin: 1.2 mg/dL (ref 0.3–1.2)
Total Protein: 6.8 g/dL (ref 6.5–8.1)

## 2022-03-08 MED ORDER — CALCIUM CARBONATE ANTACID 500 MG PO CHEW: 400.0000 mg | CHEWABLE_TABLET | Freq: Three times a day (TID) | ORAL | Status: DC | PRN

## 2022-03-08 MED ORDER — ACETAMINOPHEN 325 MG PO TABS
650.0000 mg | ORAL_TABLET | Freq: Once | ORAL | Status: AC
  Administered 2022-03-08: 650 mg via ORAL
  Filled 2022-03-08: qty 2

## 2022-03-08 MED ORDER — ONDANSETRON HCL 4 MG/2ML IJ SOLN: 4.0000 mg | Freq: Four times a day (QID) | INTRAMUSCULAR | Status: DC | PRN

## 2022-03-08 MED ORDER — LACTATED RINGERS IV BOLUS (SEPSIS)
1000.0000 mL | Freq: Once | INTRAVENOUS | Status: AC
  Administered 2022-03-08: 1000 mL via INTRAVENOUS

## 2022-03-08 MED ORDER — ASPIRIN 81 MG PO TBEC
81.0000 mg | DELAYED_RELEASE_TABLET | Freq: Every day | ORAL | Status: DC
  Administered 2022-03-08 – 2022-03-12 (×5): 81 mg via ORAL
  Filled 2022-03-08 (×5): qty 1

## 2022-03-08 MED ORDER — LACTATED RINGERS IV BOLUS (SEPSIS)
250.0000 mL | Freq: Once | INTRAVENOUS | Status: AC
  Administered 2022-03-08: 250 mL via INTRAVENOUS

## 2022-03-08 MED ORDER — ACETAMINOPHEN 500 MG PO TABS
1000.0000 mg | ORAL_TABLET | Freq: Four times a day (QID) | ORAL | Status: DC | PRN
  Administered 2022-03-08 – 2022-03-10 (×7): 1000 mg via ORAL
  Filled 2022-03-08 (×8): qty 2

## 2022-03-08 MED ORDER — SODIUM CHLORIDE 0.9 % IV BOLUS
1000.0000 mL | Freq: Once | INTRAVENOUS | Status: AC
  Administered 2022-03-08: 1000 mL via INTRAVENOUS

## 2022-03-08 MED ORDER — SODIUM CHLORIDE 0.9 % IV SOLN
2.0000 g | INTRAVENOUS | Status: DC
  Administered 2022-03-08 – 2022-03-12 (×5): 2 g via INTRAVENOUS
  Filled 2022-03-08 (×2): qty 2
  Filled 2022-03-08 (×2): qty 20
  Filled 2022-03-08: qty 2

## 2022-03-08 MED ORDER — GUAIFENESIN ER 600 MG PO TB12
600.0000 mg | ORAL_TABLET | Freq: Two times a day (BID) | ORAL | Status: DC | PRN
  Administered 2022-03-08 – 2022-03-09 (×2): 600 mg via ORAL
  Filled 2022-03-08 (×3): qty 1

## 2022-03-08 MED ORDER — ONDANSETRON HCL 4 MG/2ML IJ SOLN
4.0000 mg | Freq: Once | INTRAMUSCULAR | Status: AC
  Administered 2022-03-08: 4 mg via INTRAVENOUS
  Filled 2022-03-08: qty 2

## 2022-03-08 MED ORDER — MENTHOL 3 MG MT LOZG
1.0000 | LOZENGE | OROMUCOSAL | Status: DC | PRN
  Administered 2022-03-08: 3 mg via ORAL
  Filled 2022-03-08: qty 9

## 2022-03-08 MED ORDER — FAMOTIDINE 20 MG PO TABS
20.0000 mg | ORAL_TABLET | Freq: Two times a day (BID) | ORAL | Status: DC
  Administered 2022-03-08 – 2022-03-12 (×8): 20 mg via ORAL
  Filled 2022-03-08 (×8): qty 1

## 2022-03-08 MED ORDER — LACTATED RINGERS IV SOLN: INTRAVENOUS | Status: DC

## 2022-03-08 MED ORDER — OXYCODONE HCL 5 MG PO TABS
5.0000 mg | ORAL_TABLET | ORAL | Status: DC | PRN
  Administered 2022-03-08: 5 mg via ORAL
  Filled 2022-03-08: qty 1

## 2022-03-08 MED ORDER — FOLIC ACID 1 MG PO TABS
1.0000 mg | ORAL_TABLET | Freq: Every day | ORAL | Status: DC
  Administered 2022-03-08 – 2022-03-12 (×5): 1 mg via ORAL
  Filled 2022-03-08 (×5): qty 1

## 2022-03-08 NOTE — Progress Notes (Signed)
CODE SEPSIS - PHARMACY COMMUNICATION  **Broad Spectrum Antibiotics should be administered within 1 hour of Sepsis diagnosis**  Time Code Sepsis Called/Page Received: 1425  Antibiotics Ordered: Ceftriaxone   Time of 1st antibiotic administration: 1510  Additional action taken by pharmacy: Called ED Flex nurse to confirm that antibiotics had been given.  Gretel Acre, PharmD PGY1 Pharmacy Resident 03/08/2022 2:44 PM

## 2022-03-08 NOTE — ED Notes (Signed)
Advised nurse that patient has ready bed 

## 2022-03-08 NOTE — ED Notes (Signed)
Pt's rocephen was stopped due to the pt going to ultrasound.

## 2022-03-08 NOTE — H&P (Signed)
HISTORY AND PHYSICAL NOTE  History of Present Illness: Shaina Gullatt is a 31 y.o. G3P2002 at [redacted]w[redacted]d admitted for pyelonephritis.  She presented to the ED with complaints of right sided back pain.  She reports having a "GI bug" last week for 24 hours.  She initially felt better and then started to feel unwell again yesterday.  The back pain is right sided, sharp, stabbing, achy, and hurts worse when she moves or takes a breath in.  She has a history of UTI's but denies any current urinary symptoms.  States she's felt chills over the past couple of nights but did not take her temperature.  She denies cramping or contractions, LOF, or vaginal bleeding.  She endorses good fetal movement.   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   Pyelonephritis complicating pregnancy 03/08/2022   History of pre-eclampsia in prior pregnancy, currently pregnant 02/20/2022   High-risk pregnancy 12/01/2021   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   Acute blood loss anemia 12/23/2019   Raynaud's disease without gangrene 03/26/2015   PCOS (polycystic ovarian syndrome) 03/26/2015    Past Medical History:  Diagnosis Date   Anxiety    Depression    Hx of scleroderma    Insomnia    Polycystic disease, ovaries    Raynaud's disease     Past Surgical History:  Procedure Laterality Date   ANKLE FRACTURE SURGERY Left    ESOPHAGOGASTRODUODENOSCOPY (EGD) WITH PROPOFOL N/A  03/04/2018   Procedure: ESOPHAGOGASTRODUODENOSCOPY (EGD) WITH PROPOFOL;  Surgeon: Wyline Mood, MD;  Location: [facility] ENDOSCOPY;  Service: Gastroenterology;  Laterality: N/A;    OB History  Gravida Para Term Preterm AB Living  3 2 2     2   SAB IAB Ectopic Multiple Live Births        0 2    # Outcome Date GA Lbr Len/2nd Weight Sex Delivery Anes PTL Lv  3 Current           2 Term 12/22/19 [redacted]w[redacted]d / 00:17 3270 g F Vag-Spont None  LIV  1 Term 08/30/11    F Vag-Spont   LIV    Social History:  reports that she has never smoked. She has never used smokeless tobacco. She reports that she does not currently use alcohol. She reports that she does not use drugs.  Family History: family history includes Diabetes in her father; Hypertension in her sister.  No Known Allergies  Medications Prior to Admission  Medication Sig Dispense Refill Last Dose   [facility] ASPIRIN LOW DOSE 81 MG tablet Take 81 mg by mouth daily.   03/07/2022   DICLEGIS 10-10 MG TBEC Take by mouth.   03/07/2022   folic acid (FOLVITE) 1 MG tablet Take 1 tablet by mouth daily.   03/07/2022   Prenat-FeBis-FePro-FA-CA-Omega (PR NATAL 400) 29-1-200 & 400 MG MISC Take 1 tablet by mouth daily.   03/07/2022   Dietary Management Product (VASCULERA) TABS Take 600 mg by mouth daily. (Patient not taking: Reported on 03/08/2022)   Not Taking   famotidine (PEPCID)  40 MG tablet Take 40 mg by mouth at bedtime. (Patient not taking: Reported on 03/08/2022)   Not Taking   GAVILAX 17 GM/SCOOP powder Take 17 g by mouth daily.      hydroxychloroquine (PLAQUENIL) 200 MG tablet Take 200 mg by mouth. (Patient not taking: Reported on 03/08/2022)   Not Taking    Review of Systems  Constitutional:  Positive for chills, fever and malaise/fatigue.  Respiratory:  Positive for cough. Negative for shortness of breath.   Cardiovascular:  Negative for chest pain.  Gastrointestinal:  Positive for nausea and vomiting. Negative for abdominal pain, diarrhea and heartburn.   Genitourinary:  Positive for flank pain (R>L). Negative for dysuria, frequency and urgency.  Musculoskeletal:  Positive for back pain.  Neurological:  Negative for dizziness and headaches.  Psychiatric/Behavioral:  Negative for depression. The patient is not nervous/anxious.     Physical Examination: Vitals:  BP (!) 99/56 (BP Location: Left Arm)   Pulse 97   Temp 97.6 F (36.4 C) (Oral)   Resp 19   Ht 5\' 5"  (1.651 m)   Wt 73 kg   LMP 10/19/2021 (Exact Date)   SpO2 100%   Breastfeeding No   BMI 26.79 kg/m  General: no acute distress.  HEENT: normocephalic, atraumatic Heart: tachycardia, regular rhythm  Lungs: normal respiratory effort Abdomen: soft, gravid, non-tender, + CVA tenderness on Right side  Pelvic: deferred  Extremities: non-tender, symmetric, No edema bilaterally.  DTRs: 2+/2+  Neurologic: Alert & oriented x 3.    FHT: 164 bpm by doppler   Labs:  Results for orders placed or performed during the hospital encounter of 03/08/22 (from the past 24 hour(s))  Resp Panel by RT-PCR (Flu A&B, Covid) Anterior Nasal Swab   Collection Time: 03/08/22 12:14 PM   Specimen: Anterior Nasal Swab  Result Value Ref Range   SARS Coronavirus 2 by RT PCR NEGATIVE NEGATIVE   Influenza A by PCR NEGATIVE NEGATIVE   Influenza B by PCR NEGATIVE NEGATIVE  CBC with Differential   Collection Time: 03/08/22 12:14 PM  Result Value Ref Range   WBC 15.6 (H) 4.0 - 10.5 K/uL   RBC 4.05 3.87 - 5.11 MIL/uL   Hemoglobin 10.9 (L) 12.0 - 15.0 g/dL   HCT 34.3 (L) 36.0 - 46.0 %   MCV 84.7 80.0 - 100.0 fL   MCH 26.9 26.0 - 34.0 pg   MCHC 31.8 30.0 - 36.0 g/dL   RDW 15.3 11.5 - 15.5 %   Platelets 268 150 - 400 K/uL   nRBC 0.0 0.0 - 0.2 %   Neutrophils Relative % 88 %   Neutro Abs 13.5 (H) 1.7 - 7.7 K/uL   Lymphocytes Relative 6 %   Lymphs Abs 1.0 0.7 - 4.0 K/uL   Monocytes Relative 6 %   Monocytes Absolute 1.0 0.1 - 1.0 K/uL   Eosinophils Relative 0 %   Eosinophils Absolute 0.1 0.0 - 0.5  K/uL   Basophils Relative 0 %   Basophils Absolute 0.0 0.0 - 0.1 K/uL   Immature Granulocytes 0 %   Abs Immature Granulocytes 0.07 0.00 - 0.07 K/uL  Comprehensive metabolic panel   Collection Time: 03/08/22 12:14 PM  Result Value Ref Range   Sodium 132 (L) 135 - 145 mmol/L   Potassium 3.5 3.5 - 5.1 mmol/L   Chloride 105 98 - 111 mmol/L   CO2 20 (L) 22 - 32 mmol/L   Glucose, Bld 105 (H) 70 - 99 mg/dL   BUN 6 6 -  20 mg/dL   Creatinine, Ser 5.36 0.44 - 1.00 mg/dL   Calcium 8.0 (L) 8.9 - 10.3 mg/dL   Total Protein 6.8 6.5 - 8.1 g/dL   Albumin 3.0 (L) 3.5 - 5.0 g/dL   AST 26 15 - 41 U/L   ALT 32 0 - 44 U/L   Alkaline Phosphatase 78 38 - 126 U/L   Total Bilirubin 1.2 0.3 - 1.2 mg/dL   GFR, Estimated >64 >40 mL/min   Anion gap 7 5 - 15  Urinalysis, Routine w reflex microscopic   Collection Time: 03/08/22 12:14 PM  Result Value Ref Range   Color, Urine YELLOW (A) YELLOW   APPearance CLOUDY (A) CLEAR   Specific Gravity, Urine 1.014 1.005 - 1.030   pH 5.0 5.0 - 8.0   Glucose, UA NEGATIVE NEGATIVE mg/dL   Hgb urine dipstick NEGATIVE NEGATIVE   Bilirubin Urine NEGATIVE NEGATIVE   Ketones, ur 80 (A) NEGATIVE mg/dL   Protein, ur 347 (A) NEGATIVE mg/dL   Nitrite NEGATIVE NEGATIVE   Leukocytes,Ua LARGE (A) NEGATIVE   RBC / HPF >50 (H) 0 - 5 RBC/hpf   WBC, UA >50 (H) 0 - 5 WBC/hpf   Bacteria, UA RARE (A) NONE SEEN   Squamous Epithelial / LPF 0-5 0 - 5   WBC Clumps PRESENT    Mucus PRESENT   Lactic acid, plasma   Collection Time: 03/08/22  4:25 PM  Result Value Ref Range   Lactic Acid, Venous 1.0 0.5 - 1.9 mmol/L  Lactic acid, plasma   Collection Time: 03/08/22  6:17 PM  Result Value Ref Range   Lactic Acid, Venous 1.8 0.5 - 1.9 mmol/L    Prenatal Labs: Blood type/Rh A Pos   Antibody screen neg  Rubella Immune  Varicella Immune  RPR NR  HBsAg Neg  Hep C NR  HIV NR  GC neg  Chlamydia neg  Genetic screening cfDNA negative     Imaging Studies: US OB Limited  Result  Date: 03/08/2022 CLINICAL DATA:  Abdominal pain. EXAM: LIMITED OBSTETRIC ULTRASOUND COMPARISON:  None Available. FINDINGS: Number of Fetuses: 1 Heart Rate:  164 bpm Movement: Yes Presentation: Cephalic Placental Location: Posterior Previa: No Amniotic Fluid (Subjective):  Within normal limits. BPD: 5.1 cm 21 w  3 d MATERNAL FINDINGS: Cervix:  Appears closed. Uterus/Adnexae: No abnormality visualized. IMPRESSION: 1. Single viable intrauterine pregnancy with estimated gestational age [redacted] weeks 3 days. 2. Normal sonographic appearance of the ovaries. This exam is performed on an emergent basis and does not comprehensively evaluate fetal size, dating, or anatomy; follow-up complete OB US should be considered if further fetal assessment is warranted. Electronically Signed   By: Sherron Ales M.D.   On: 03/08/2022 16:46   US Renal  Result Date: 03/08/2022 CLINICAL DATA:  Right upper quadrant pain, right flank pain, abdominal pain and fever EXAM: RENAL / URINARY TRACT ULTRASOUND COMPLETE COMPARISON:  None Available. FINDINGS: Right Kidney: Renal measurements: 11.9 x 5.2 x 6.2 cm = volume: 203 mL. Echogenicity within normal limits. No mass visualized. Mild right hydronephrosis and proximal hydroureter. Left Kidney: Renal measurements: 12.1 x 5.9 x 5.5 cm = volume: 204 mL. Echogenicity within normal limits. No mass or hydronephrosis visualized. Bladder: Appears normal for degree of bladder distention. Other: None. IMPRESSION: Mild right hydronephrosis and proximal hydroureter without calculi or other obstructing etiology identified by ultrasound. Consider CT or MRI to further evaluate. Electronically Signed   By: Jearld Lesch M.D.   On: 03/08/2022 16:45   US  Abdomen Limited RUQ (LIVER/GB)  Result Date: 03/08/2022 CLINICAL DATA:  [id number] EXAM: ULTRASOUND ABDOMEN LIMITED RIGHT UPPER QUADRANT COMPARISON:  None Available. FINDINGS: Gallbladder: No gallstones or wall thickening visualized. No sonographic Murphy sign noted  by sonographer. Common bile duct: Diameter: 6 mm. Liver: No focal lesion identified. Within normal limits in parenchymal echogenicity. Portal vein is patent on color Doppler imaging with normal direction of blood flow towards the liver. Other: None. IMPRESSION: Unremarkable right upper quadrant ultrasound. Electronically Signed   By: Tish Frederickson M.D.   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 Admit to Antenatal -Dr. Jean Rosenthal notified of admission and plan  -FHT q shift  -Activity as tolerated -Bathroom privileges  -Regular diet  -Routine antenatal care -OB US today shows single, viable IUP, c/w dates, FHR 164 bpm  2. Pyelonephritis  -Flank pain, fever, and + CVA tenderness concerning for Pyelonephritis -No evidence of renal calculi on renal US or gallbladder disease on RUQ [place] today  -Rocephin IV every 24 hours  -IVF resuscitation per sepsis protocol  -Tachycardia has improved with IV fluid bolus and acetaminophen  -Acetaminophen for fever > 100.4 or mild pain -PRN oxycodone ordered for moderate to severe pain  -CBC and CMP in AM, urine culture pending  -Continuous pulse ox  -Will plan to continue IV antibiotics until 24 hours fever free.   -TMax 38.2 at 1622 today, last dose of acetaminophen at 1648  Gustavo Lah, CNM  Certified Nurse Midwife [facility]

## 2022-03-08 NOTE — ED Triage Notes (Signed)
Patient is 5 months pregnant. Patient c/o fever, chills, N/V, and right side pain. C/o dark urine. Reports sharp pain to right side. Symptoms started 2 days ago.

## 2022-03-08 NOTE — ED Provider Notes (Signed)
[facility] Provider Note    Event Date/Time   First MD Initiated Contact with Patient 03/08/22 1424     (approximate)   History   Fever, Chills, and Emesis During Pregnancy   HPI  Angelica Valenzuela is a 31 y.o. female currently 5 months pregnant here with right side pain.  The patient states that for the last week, she has had progressively worsening aching, throbbing, gnawing, right flank pain.  She had associated chills, fever.  She said decreased appetite and low energy throughout the day today.  Denies overt urinary symptoms other than darker than usual urine.  She states she feels generally unwell and weak.  She has been taking her nausea meds at home without relief.  No vaginal bleeding or discharge.  She has some cramping when she vomits but no anterior abdominal pain, it is mostly located to the right.  No history of kidney stones.  She does have history of UTIs.     Physical Exam   Triage Vital Signs: ED Triage Vitals  Enc Vitals Group     BP 03/08/22 1212 (!) 109/55     Pulse Rate 03/08/22 1212 (!) 127     Resp 03/08/22 1212 16     Temp 03/08/22 1212 100.2 F (37.9 C)     Temp Source 03/08/22 1212 Oral     SpO2 03/08/22 1212 96 %     Weight 03/08/22 1213 161 lb (73 kg)     Height 03/08/22 1213 5\' 5"  (1.651 m)     Head Circumference --      Peak Flow --      Pain Score 03/08/22 1213 8     Pain Loc --      Pain Edu? --      Excl. in [place]? --     Most recent vital signs: Vitals:   03/08/22 1212 03/08/22 1622  BP: (!) 109/55 101/66  Pulse: (!) 127 86  Resp: 16 18  Temp: 100.2 F (37.9 C) (!) 100.7 F (38.2 C)  SpO2: 96% 96%     General: Awake, no distress.  CV:  Good peripheral perfusion.  Tachycardic. Resp:  Normal effort.  Abd:  No distention.  Moderate right CVA tenderness, tenderness to palpation of the right flank but not overtly the right upper quadrant with negative Murphy's.  No right lower quadrant  tenderness. Other:  Dry mucous membranes.   ED Results / Procedures / Treatments   Labs (all labs ordered are listed, but only abnormal results are displayed) Labs Reviewed  CBC WITH DIFFERENTIAL/PLATELET - Abnormal; Notable for the following components:      Result Value   WBC 15.6 (*)    Hemoglobin 10.9 (*)    HCT 34.3 (*)    Neutro Abs 13.5 (*)    All other components within normal limits  COMPREHENSIVE METABOLIC PANEL - Abnormal; Notable for the following components:   Sodium 132 (*)    CO2 20 (*)    Glucose, Bld 105 (*)    Calcium 8.0 (*)    Albumin 3.0 (*)    All other components within normal limits  URINALYSIS, ROUTINE W REFLEX MICROSCOPIC - Abnormal; Notable for the following components:   Color, Urine YELLOW (*)    APPearance CLOUDY (*)    Ketones, ur 80 (*)    Protein, ur 100 (*)    Leukocytes,Ua LARGE (*)    RBC / HPF >50 (*)    WBC, UA >50 (*)  Bacteria, UA RARE (*)    All other components within normal limits  RESP PANEL BY RT-PCR (FLU A&B, COVID) ARPGX2  CULTURE, BLOOD (ROUTINE X 2)  CULTURE, BLOOD (ROUTINE X 2)  URINE CULTURE  LACTIC ACID, PLASMA  LACTIC ACID, PLASMA  CBC  COMPREHENSIVE METABOLIC PANEL  POC URINE PREG, ED     EKG    RADIOLOGY Ultrasound renal: Mild hydro and proximal hydroureter without stone Ultrasound abdomen: Negative Ultrasound OB: Viable IUP at 21 weeks 3 days   I also independently reviewed and agree with radiologist interpretations.   PROCEDURES:  Critical Care performed: Yes, see critical care procedure note(s)  .Critical Care  Performed by: Shaune Pollack, MD Authorized by: Shaune Pollack, MD   Critical care provider statement:    Critical care time (minutes):  30   Critical care was necessary to treat or prevent imminent or life-threatening deterioration of the following conditions:  Circulatory failure, cardiac failure, respiratory failure and sepsis   Critical care was time spent personally by me on  the following activities:  Development of treatment plan with patient or surrogate, discussions with consultants, evaluation of patient's response to treatment, examination of patient, ordering and review of laboratory studies, ordering and review of radiographic studies, ordering and performing treatments and interventions, pulse oximetry, re-evaluation of patient's condition and review of old charts     MEDICATIONS ORDERED IN ED: Medications  lactated ringers infusion (has no administration in time range)  lactated ringers bolus 1,000 mL (1,000 mLs Intravenous New Bag/Given 03/08/22 1521)    And  lactated ringers bolus 1,000 mL (1,000 mLs Intravenous New Bag/Given 03/08/22 1715)    And  lactated ringers bolus 250 mL (has no administration in time range)  cefTRIAXone (ROCEPHIN) 2 g in sodium chloride 0.9 % 100 mL IVPB (0 g Intravenous Stopped 03/08/22 1520)  acetaminophen (TYLENOL) tablet 1,000 mg (has no administration in time range)  aspirin EC tablet 81 mg (has no administration in time range)  folic acid (FOLVITE) tablet 1 mg (has no administration in time range)  ondansetron (ZOFRAN) injection 4 mg (has no administration in time range)  calcium carbonate (TUMS - dosed in mg elemental calcium) chewable tablet 400 mg of elemental calcium (has no administration in time range)  famotidine (PEPCID) tablet 20 mg (has no administration in time range)  sodium chloride 0.9 % bolus 1,000 mL (1,000 mLs Intravenous New Bag/Given 03/08/22 1224)  ondansetron (ZOFRAN) injection 4 mg (4 mg Intravenous Given 03/08/22 1223)  acetaminophen (TYLENOL) tablet 650 mg (650 mg Oral Given 03/08/22 1648)     IMPRESSION / MDM / ASSESSMENT AND PLAN / ED COURSE  I reviewed the triage vital signs and the nursing notes.                               The patient is on the cardiac monitor to evaluate for evidence of arrhythmia and/or significant heart rate changes.   Ddx:  Differential includes the following, with  pertinent life- or limb-threatening emergencies considered:  Sepsis 2/2 UTI, pyelonephritis, cholecystitis, enteritis, diverticulitis, colitis, endometritis  Patient's presentation is most consistent with acute presentation with potential threat to life or bodily function.  MDM:  31 year old female here with severe right flank pain, nausea, vomiting.  Patient arrives febrile, tachycardic, borderline hypotensive.  Lab work shows significant leukocytosis and dehydration on CMP.  Lactic acid is normal.  Concern for pyelonephritis clinically and patient started on broad-spectrum antibiotics  with IV fluids for sepsis criteria.  Ultrasounds obtained, reviewed.  Patient has some mild hydro which I suspect is due to her pyelonephritis.  There is no visible stones on the ultrasound.  The pain began fairly gradually and do not suspect infected stone clinically at this time.  Ultrasound of the right upper quadrant is unremarkable.  No complications noted on OB ultrasound.  Given her age, nausea, vomiting, sepsis criteria with UTI, will admit for pyelonephritis and pregnancy.  OB to admit.    MEDICATIONS GIVEN IN ED: Medications  lactated ringers infusion (has no administration in time range)  lactated ringers bolus 1,000 mL (1,000 mLs Intravenous New Bag/Given 03/08/22 1521)    And  lactated ringers bolus 1,000 mL (1,000 mLs Intravenous New Bag/Given 03/08/22 1715)    And  lactated ringers bolus 250 mL (has no administration in time range)  cefTRIAXone (ROCEPHIN) 2 g in sodium chloride 0.9 % 100 mL IVPB (0 g Intravenous Stopped 03/08/22 1520)  acetaminophen (TYLENOL) tablet 1,000 mg (has no administration in time range)  aspirin EC tablet 81 mg (has no administration in time range)  folic acid (FOLVITE) tablet 1 mg (has no administration in time range)  ondansetron (ZOFRAN) injection 4 mg (has no administration in time range)  calcium carbonate (TUMS - dosed in mg elemental calcium) chewable tablet 400 mg  of elemental calcium (has no administration in time range)  famotidine (PEPCID) tablet 20 mg (has no administration in time range)  sodium chloride 0.9 % bolus 1,000 mL (1,000 mLs Intravenous New Bag/Given 03/08/22 1224)  ondansetron (ZOFRAN) injection 4 mg (4 mg Intravenous Given 03/08/22 1223)  acetaminophen (TYLENOL) tablet 650 mg (650 mg Oral Given 03/08/22 1648)     Consults:  OB   EMR reviewed       FINAL CLINICAL IMPRESSION(S) / ED DIAGNOSES   Final diagnoses:  Pyelonephritis  Sepsis without acute organ dysfunction, due to unspecified organism [facility])     Rx / DC Orders   ED Discharge Orders     None        Note:  This document was prepared using Dragon voice recognition software and may include unintentional dictation errors.   Shaune Pollack, MD 03/08/22 [phone]

## 2022-03-08 NOTE — ED Notes (Signed)
The pt received 500 cc's of lactated ringers while at ultra sound.

## 2022-03-08 NOTE — Sepsis Progress Note (Signed)
Sepsis protocol is being followed by [facility]. 

## 2022-03-09 LAB — COMPREHENSIVE METABOLIC PANEL
ALT: 22 U/L (ref 0–44)
AST: 20 U/L (ref 15–41)
Albumin: 2.3 g/dL — ABNORMAL LOW (ref 3.5–5.0)
Alkaline Phosphatase: 55 U/L (ref 38–126)
Anion gap: 4 — ABNORMAL LOW (ref 5–15)
BUN: <5 mg/dL — ABNORMAL LOW (ref 6–20)
CO2: 21 mmol/L — ABNORMAL LOW (ref 22–32)
Calcium: 7.6 mg/dL — ABNORMAL LOW (ref 8.9–10.3)
Chloride: 112 mmol/L — ABNORMAL HIGH (ref 98–111)
Creatinine, Ser: 0.34 mg/dL — ABNORMAL LOW (ref 0.44–1.00)
GFR, Estimated: >60 mL/min (ref >60)
Glucose, Bld: 94 mg/dL (ref 70–99)
Potassium: 3.6 mmol/L (ref 3.5–5.1)
Sodium: 137 mmol/L (ref 135–145)
Total Bilirubin: 1 mg/dL (ref 0.3–1.2)
Total Protein: 5.5 g/dL — ABNORMAL LOW (ref 6.5–8.1)

## 2022-03-09 LAB — CBC
HCT: 27.7 % — ABNORMAL LOW (ref 36.0–46.0)
Hemoglobin: 8.6 g/dL — ABNORMAL LOW (ref 12.0–15.0)
MCH: 27.4 pg (ref 26.0–34.0)
MCHC: 31 g/dL (ref 30.0–36.0)
MCV: 88.2 fL (ref 80.0–100.0)
Platelets: 218 10*3/uL (ref 150–400)
RBC: 3.14 MIL/uL — ABNORMAL LOW (ref 3.87–5.11)
RDW: 15.4 % (ref 11.5–15.5)
WBC: 11.8 10*3/uL — ABNORMAL HIGH (ref 4.0–10.5)
nRBC: 0 % (ref 0.0–0.2)

## 2022-03-09 MED ORDER — SODIUM CHLORIDE 0.9 % IV SOLN: INTRAVENOUS | Status: DC | PRN

## 2022-03-09 MED ORDER — SODIUM CHLORIDE 0.9 % IV SOLN
300.0000 mg | Freq: Once | INTRAVENOUS | Status: AC
  Administered 2022-03-09: 300 mg via INTRAVENOUS
  Filled 2022-03-09: qty 300

## 2022-03-09 NOTE — Progress Notes (Signed)
ANTEPARTUM PROGRESS NOTE  Angelica LangoClaudia Mathias is a 31 y.o. G3P2002 at 6584w1d who is admitted for Pyonephritis .   Estimated Date of Delivery: 07/26/22  Length of Stay:  1 Days. Admitted 03/08/2022  Subjective: Feeling much better after the antibiotics and with tylenol, but still has chills and feels like she has a fever when the tylenol wears  She reports:  -active fetal movement -no leakage of fluid -no vaginal bleeding -no contractions  Vitals:  BP (!) 92/58 (BP Location: Left Arm)   Pulse 88   Temp 98 F (36.7 C) (Oral)   Resp 17   Ht 5\' 5"  (1.651 m)   Wt 73 kg   LMP 10/19/2021 (Exact Date)   SpO2 99%   Breastfeeding No   BMI 26.79 kg/m  Physical Exam HENT:     Head: Normocephalic.  Cardiovascular:     Rate and Rhythm: Normal rate.     Pulses: Normal pulses.  Pulmonary:     Effort: Pulmonary effort is normal.  Abdominal:     Tenderness: There is right CVA tenderness.  Musculoskeletal:        General: Normal range of motion.  Skin:    General: Skin is warm and dry.  Neurological:     Mental Status: She is alert and oriented to person, place, and time.  Psychiatric:        Mood and Affect: Mood normal.        Behavior: Behavior normal.        Thought Content: Thought content normal.        Judgment: Judgment normal.      Results for orders placed or performed during the hospital encounter of 03/08/22 (from the past 48 hour(s))  Resp Panel by RT-PCR (Flu A&B, Covid) Anterior Nasal Swab     Status: None   Collection Time: 03/08/22 12:14 PM   Specimen: Anterior Nasal Swab  Result Value Ref Range   SARS Coronavirus 2 by RT PCR NEGATIVE NEGATIVE    Comment: (NOTE) SARS-CoV-2 target nucleic acids are NOT DETECTED.  The SARS-CoV-2 RNA is generally detectable in upper respiratory specimens during the acute phase of infection. The lowest concentration of SARS-CoV-2 viral copies this assay can detect is 138 copies/mL. A negative result does not preclude  SARS-Cov-2 infection and should not be used as the sole basis for treatment or other patient management decisions. A negative result may occur with  improper specimen collection/handling, submission of specimen other than nasopharyngeal swab, presence of viral mutation(s) within the areas targeted by this assay, and inadequate number of viral copies(<138 copies/mL). A negative result must be combined with clinical observations, patient history, and epidemiological information. The expected result is Negative.  Fact Sheet for Patients:  BloggerCourse.comhttps://www.fda.gov/media/152166/download  Fact Sheet for Healthcare Providers:  SeriousBroker.ithttps://www.fda.gov/media/152162/download  This test is no t yet approved or cleared by the [place] FDA and  has been authorized for detection and/or diagnosis of SARS-CoV-2 by FDA under an Emergency Use Authorization (EUA). This EUA will remain  in effect (meaning this test can be used) for the duration of the COVID-19 declaration under Section 564(b)(1) of the Act, 21 U.S.C.section 360bbb-3(b)(1), unless the authorization is terminated  or revoked sooner.       Influenza A by PCR NEGATIVE NEGATIVE   Influenza B by PCR NEGATIVE NEGATIVE    Comment: (NOTE) The Xpert Xpress SARS-CoV-2/FLU/RSV plus assay is intended as an aid in the diagnosis of influenza from Nasopharyngeal swab specimens and should not be used  as a sole basis for treatment. Nasal washings and aspirates are unacceptable for Xpert Xpress SARS-CoV-2/FLU/RSV testing.  Fact Sheet for Patients: EntrepreneurPulse.com.au  Fact Sheet for Healthcare Providers: IncredibleEmployment.be  This test is not yet approved or cleared by the [place] FDA and has been authorized for detection and/or diagnosis of SARS-CoV-2 by FDA under an Emergency Use Authorization (EUA). This EUA will remain in effect (meaning this test can be used) for the duration of the COVID-19  declaration under Section 564(b)(1) of the Act, 21 U.S.C. section 360bbb-3(b)(1), unless the authorization is terminated or revoked.  Performed at [facility], [address] [place], [place] [address]   CBC with Differential     Status: Abnormal   Collection Time: 03/08/22 12:14 PM  Result Value Ref Range   WBC 15.6 (H) 4.0 - 10.5 K/uL   RBC 4.05 3.87 - 5.11 MIL/uL   Hemoglobin 10.9 (L) 12.0 - 15.0 g/dL   HCT 34.3 (L) 36.0 - 46.0 %   MCV 84.7 80.0 - 100.0 fL   MCH 26.9 26.0 - 34.0 pg   MCHC 31.8 30.0 - 36.0 g/dL   RDW 15.3 11.5 - 15.5 %   Platelets 268 150 - 400 K/uL   nRBC 0.0 0.0 - 0.2 %   Neutrophils Relative % 88 %   Neutro Abs 13.5 (H) 1.7 - 7.7 K/uL   Lymphocytes Relative 6 %   Lymphs Abs 1.0 0.7 - 4.0 K/uL   Monocytes Relative 6 %   Monocytes Absolute 1.0 0.1 - 1.0 K/uL   Eosinophils Relative 0 %   Eosinophils Absolute 0.1 0.0 - 0.5 K/uL   Basophils Relative 0 %   Basophils Absolute 0.0 0.0 - 0.1 K/uL   Immature Granulocytes 0 %   Abs Immature Granulocytes 0.07 0.00 - 0.07 K/uL    Comment: Performed at [facility], [address] [place], [place] [address]  Comprehensive metabolic panel     Status: Abnormal   Collection Time: 03/08/22 12:14 PM  Result Value Ref Range   Sodium 132 (L) 135 - 145 mmol/L   Potassium 3.5 3.5 - 5.1 mmol/L   Chloride 105 98 - 111 mmol/L   CO2 20 (L) 22 - 32 mmol/L   Glucose, Bld 105 (H) 70 - 99 mg/dL    Comment: Glucose reference range applies only to samples taken after fasting for at least 8 hours.   BUN 6 6 - 20 mg/dL   Creatinine, Ser 0.53 0.44 - 1.00 mg/dL   Calcium 8.0 (L) 8.9 - 10.3 mg/dL   Total Protein 6.8 6.5 - 8.1 g/dL   Albumin 3.0 (L) 3.5 - 5.0 g/dL   AST 26 15 - 41 U/L   ALT 32 0 - 44 U/L   Alkaline Phosphatase 78 38 - 126 U/L   Total Bilirubin 1.2 0.3 - 1.2 mg/dL   GFR, Estimated >60 >60 mL/min    Comment: (NOTE) Calculated using the CKD-EPI Creatinine Equation (2021)    Anion gap 7 5 - 15     Comment: Performed at [facility], [address] [place], [place] [address]  Urinalysis, Routine w reflex microscopic     Status: Abnormal   Collection Time: 03/08/22 12:14 PM  Result Value Ref Range   Color, Urine YELLOW (A) YELLOW   APPearance CLOUDY (A) CLEAR   Specific Gravity, Urine 1.014 1.005 - 1.030   pH 5.0 5.0 - 8.0   Glucose, UA NEGATIVE NEGATIVE mg/dL   Hgb urine dipstick  NEGATIVE NEGATIVE   Bilirubin Urine NEGATIVE NEGATIVE   Ketones, ur 80 (A) NEGATIVE mg/dL   Protein, ur 100 (A) NEGATIVE mg/dL   Nitrite NEGATIVE NEGATIVE   Leukocytes,Ua LARGE (A) NEGATIVE   RBC / HPF >50 (H) 0 - 5 RBC/hpf   WBC, UA >50 (H) 0 - 5 WBC/hpf   Bacteria, UA RARE (A) NONE SEEN   Squamous Epithelial / LPF 0-5 0 - 5   WBC Clumps PRESENT    Mucus PRESENT     Comment: Performed at [facility], [address] [place], [place] [address]  Lactic acid, plasma     Status: None   Collection Time: 03/08/22  4:25 PM  Result Value Ref Range   Lactic Acid, Venous 1.0 0.5 - 1.9 mmol/L    Comment: Performed at [facility], [address] [place], [place] [address]  Lactic acid, plasma     Status: None   Collection Time: 03/08/22  6:17 PM  Result Value Ref Range   Lactic Acid, Venous 1.8 0.5 - 1.9 mmol/L    Comment: Performed at [facility], [address] [place], [place] [address]  CBC     Status: Abnormal   Collection Time: 03/09/22  6:22 AM  Result Value Ref Range   WBC 11.8 (H) 4.0 - 10.5 K/uL   RBC 3.14 (L) 3.87 - 5.11 MIL/uL   Hemoglobin 8.6 (L) 12.0 - 15.0 g/dL   HCT 27.7 (L) 36.0 - 46.0 %   MCV 88.2 80.0 - 100.0 fL   MCH 27.4 26.0 - 34.0 pg   MCHC 31.0 30.0 - 36.0 g/dL   RDW 15.4 11.5 - 15.5 %   Platelets 218 150 - 400 K/uL   nRBC 0.0 0.0 - 0.2 %    Comment: Performed at [facility], [address] [place], [place] [address]  Comprehensive metabolic panel     Status: Abnormal   Collection Time: 03/09/22  6:22 AM  Result  Value Ref Range   Sodium 137 135 - 145 mmol/L   Potassium 3.6 3.5 - 5.1 mmol/L   Chloride 112 (H) 98 - 111 mmol/L   CO2 21 (L) 22 - 32 mmol/L   Glucose, Bld 94 70 - 99 mg/dL    Comment: Glucose reference range applies only to samples taken after fasting for at least 8 hours.   BUN <5 (L) 6 - 20 mg/dL   Creatinine, Ser 0.34 (L) 0.44 - 1.00 mg/dL   Calcium 7.6 (L) 8.9 - 10.3 mg/dL   Total Protein 5.5 (L) 6.5 - 8.1 g/dL   Albumin 2.3 (L) 3.5 - 5.0 g/dL   AST 20 15 - 41 U/L   ALT 22 0 - 44 U/L   Alkaline Phosphatase 55 38 - 126 U/L   Total Bilirubin 1.0 0.3 - 1.2 mg/dL   GFR, Estimated >60 >60 mL/min    Comment: (NOTE) Calculated using the CKD-EPI Creatinine Equation (2021)    Anion gap 4 (L) 5 - 15    Comment: Performed at [facility], [address], [place], [place] [address]    [place] [employer]  Result Date: 03/08/2022 CLINICAL DATA:  Abdominal pain. EXAM: LIMITED OBSTETRIC ULTRASOUND COMPARISON:  None Available. FINDINGS: Number of Fetuses: 1 Heart Rate:  164 bpm Movement: Yes Presentation: Cephalic Placental Location: Posterior Previa: No Amniotic Fluid (Subjective):  Within normal limits. BPD: 5.1 cm 21 w  3 d MATERNAL FINDINGS: Cervix:  Appears closed. Uterus/Adnexae: No abnormality visualized. IMPRESSION:  1. Single viable intrauterine pregnancy with estimated gestational age [redacted] weeks 3 days. 2. Normal sonographic appearance of the ovaries. This exam is performed on an emergent basis and does not comprehensively evaluate fetal size, dating, or anatomy; follow-up complete OB US should be considered if further fetal assessment is warranted. Electronically Signed   By: Ileana Roup M.D.   On: 03/08/2022 16:46   US Renal  Result Date: 03/08/2022 CLINICAL DATA:  Right upper quadrant pain, right flank pain, abdominal pain and fever EXAM: RENAL / URINARY TRACT ULTRASOUND COMPLETE COMPARISON:  None Available. FINDINGS: Right Kidney: Renal measurements: 11.9 x 5.2 x 6.2 cm = volume:  203 mL. Echogenicity within normal limits. No mass visualized. Mild right hydronephrosis and proximal hydroureter. Left Kidney: Renal measurements: 12.1 x 5.9 x 5.5 cm = volume: 204 mL. Echogenicity within normal limits. No mass or hydronephrosis visualized. Bladder: Appears normal for degree of bladder distention. Other: None. IMPRESSION: Mild right hydronephrosis and proximal hydroureter without calculi or other obstructing etiology identified by ultrasound. Consider CT or MRI to further evaluate. Electronically Signed   By: Delanna Ahmadi M.D.   On: 03/08/2022 16:45   US Abdomen Limited RUQ (LIVER/GB)  Result Date: 03/08/2022 CLINICAL DATA:  [id number] EXAM: ULTRASOUND ABDOMEN LIMITED RIGHT UPPER QUADRANT COMPARISON:  None Available. FINDINGS: Gallbladder: No gallstones or wall thickening visualized. No sonographic Murphy sign noted by sonographer. Common bile duct: Diameter: 6 mm. Liver: No focal lesion identified. Within normal limits in parenchymal echogenicity. Portal vein is patent on color Doppler imaging with normal direction of blood flow towards the liver. Other: None. IMPRESSION: Unremarkable right upper quadrant ultrasound. Electronically Signed   By: Iven Finn M.D.   On: 03/08/2022 16:44    Current scheduled medications  aspirin EC  81 mg Oral Daily   famotidine  20 mg Oral BID   folic acid  1 mg Oral Daily    I have reviewed the patient's current medications. Last fever was day of admission. 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

## 2022-03-10 DIAGNOSIS — B9620 Unspecified Escherichia coli [E. coli] as the cause of diseases classified elsewhere: Secondary | ICD-10-CM | POA: Diagnosis not present

## 2022-03-10 DIAGNOSIS — O99012 Anemia complicating pregnancy, second trimester: Secondary | ICD-10-CM | POA: Diagnosis not present

## 2022-03-10 DIAGNOSIS — D649 Anemia, unspecified: Secondary | ICD-10-CM

## 2022-03-10 DIAGNOSIS — O99352 Diseases of the nervous system complicating pregnancy, second trimester: Secondary | ICD-10-CM

## 2022-03-10 DIAGNOSIS — Z3A20 20 weeks gestation of pregnancy: Secondary | ICD-10-CM

## 2022-03-10 DIAGNOSIS — O2342 Unspecified infection of urinary tract in pregnancy, second trimester: Secondary | ICD-10-CM | POA: Diagnosis not present

## 2022-03-10 DIAGNOSIS — O2302 Infections of kidney in pregnancy, second trimester: Principal | ICD-10-CM

## 2022-03-10 DIAGNOSIS — G43909 Migraine, unspecified, not intractable, without status migrainosus: Secondary | ICD-10-CM

## 2022-03-10 LAB — CBC
HCT: 26.5 % — ABNORMAL LOW (ref 36.0–46.0)
Hemoglobin: 8.4 g/dL — ABNORMAL LOW (ref 12.0–15.0)
MCH: 26.9 pg (ref 26.0–34.0)
MCHC: 31.7 g/dL (ref 30.0–36.0)
MCV: 84.9 fL (ref 80.0–100.0)
Platelets: 228 10*3/uL (ref 150–400)
RBC: 3.12 MIL/uL — ABNORMAL LOW (ref 3.87–5.11)
RDW: 15.6 % — ABNORMAL HIGH (ref 11.5–15.5)
WBC: 7.3 10*3/uL (ref 4.0–10.5)
nRBC: 0 % (ref 0.0–0.2)

## 2022-03-10 LAB — URINE CULTURE: Culture: >=100000 — AB

## 2022-03-10 NOTE — Progress Notes (Signed)
ANTEPARTUM PROGRESS NOTE  Angelica Valenzuela is a 31 y.o. G3P2002 at [redacted]w[redacted]d who is admitted for Pyonephritis .   Estimated Date of Delivery: 07/26/22  Length of Stay:  2 Days. Admitted 03/08/2022  Subjective: Pt is in good spirits and feels fine this am  She reports:  -active fetal movement -no leakage of fluid -no vaginal bleeding -no contractions  Vitals:  BP (!) 88/53 (BP Location: Left Arm)   Pulse 75   Temp 97.9 F (36.6 C) (Oral)   Resp 16   Ht 5\' 5"  (1.651 m)   Wt 73 kg   LMP 10/19/2021 (Exact Date)   SpO2 100%   Breastfeeding No   BMI 26.79 kg/m  Physical Exam Cardiovascular:     Rate and Rhythm: Normal rate.  Pulmonary:     Effort: Pulmonary effort is normal.  Abdominal:     Tenderness: There is right CVA tenderness.  Musculoskeletal:        General: Normal range of motion.  Skin:    General: Skin is warm and dry.  Neurological:     Mental Status: She is alert and oriented to person, place, and time.  Psychiatric:        Mood and Affect: Mood normal.        Behavior: Behavior normal.        Thought Content: Thought content normal.        Judgment: Judgment normal.      Results for orders placed or performed during the hospital encounter of 03/08/22 (from the past 48 hour(s))  Resp Panel by RT-PCR (Flu A&B, Covid) Anterior Nasal Swab     Status: None   Collection Time: 03/08/22 12:14 PM   Specimen: Anterior Nasal Swab  Result Value Ref Range   SARS Coronavirus 2 by RT PCR NEGATIVE NEGATIVE    Comment: (NOTE) SARS-CoV-2 target nucleic acids are NOT DETECTED.  The SARS-CoV-2 RNA is generally detectable in upper respiratory specimens during the acute phase of infection. The lowest concentration of SARS-CoV-2 viral copies this assay can detect is 138 copies/mL. A negative result does not preclude SARS-Cov-2 infection and should not be used as the sole basis for treatment or other patient management decisions. A negative result may occur with  improper  specimen collection/handling, submission of specimen other than nasopharyngeal swab, presence of viral mutation(s) within the areas targeted by this assay, and inadequate number of viral copies(<138 copies/mL). A negative result must be combined with clinical observations, patient history, and epidemiological information. The expected result is Negative.  Fact Sheet for Patients:  EntrepreneurPulse.com.au  Fact Sheet for Healthcare Providers:  IncredibleEmployment.be  This test is no t yet approved or cleared by the [place] FDA and  has been authorized for detection and/or diagnosis of SARS-CoV-2 by FDA under an Emergency Use Authorization (EUA). This EUA will remain  in effect (meaning this test can be used) for the duration of the COVID-19 declaration under Section 564(b)(1) of the Act, 21 U.S.C.section 360bbb-3(b)(1), unless the authorization is terminated  or revoked sooner.       Influenza A by PCR NEGATIVE NEGATIVE   Influenza B by PCR NEGATIVE NEGATIVE    Comment: (NOTE) The Xpert Xpress SARS-CoV-2/FLU/RSV plus assay is intended as an aid in the diagnosis of influenza from Nasopharyngeal swab specimens and should not be used as a sole basis for treatment. Nasal washings and aspirates are unacceptable for Xpert Xpress SARS-CoV-2/FLU/RSV testing.  Fact Sheet for Patients: EntrepreneurPulse.com.au  Fact Sheet for Healthcare Providers:  IncredibleEmployment.be  This test is not yet approved or cleared by the [place] and has been authorized for detection and/or diagnosis of SARS-CoV-2 by FDA under an Emergency Use Authorization (EUA). This EUA will remain in effect (meaning this test can be used) for the duration of the COVID-19 declaration under Section 564(b)(1) of the Act, 21 U.S.C. section 360bbb-3(b)(1), unless the authorization is terminated or revoked.  Performed at [facility], [address] [place], [place] [address]   CBC with Differential     Status: Abnormal   Collection Time: 03/08/22 12:14 PM  Result Value Ref Range   WBC 15.6 (H) 4.0 - 10.5 K/uL   RBC 4.05 3.87 - 5.11 MIL/uL   Hemoglobin 10.9 (L) 12.0 - 15.0 g/dL   HCT 34.3 (L) 36.0 - 46.0 %   MCV 84.7 80.0 - 100.0 fL   MCH 26.9 26.0 - 34.0 pg   MCHC 31.8 30.0 - 36.0 g/dL   RDW 15.3 11.5 - 15.5 %   Platelets 268 150 - 400 K/uL   nRBC 0.0 0.0 - 0.2 %   Neutrophils Relative % 88 %   Neutro Abs 13.5 (H) 1.7 - 7.7 K/uL   Lymphocytes Relative 6 %   Lymphs Abs 1.0 0.7 - 4.0 K/uL   Monocytes Relative 6 %   Monocytes Absolute 1.0 0.1 - 1.0 K/uL   Eosinophils Relative 0 %   Eosinophils Absolute 0.1 0.0 - 0.5 K/uL   Basophils Relative 0 %   Basophils Absolute 0.0 0.0 - 0.1 K/uL   Immature Granulocytes 0 %   Abs Immature Granulocytes 0.07 0.00 - 0.07 K/uL    Comment: Performed at [facility], [address] [place], [place] [address]  Comprehensive metabolic panel     Status: Abnormal   Collection Time: 03/08/22 12:14 PM  Result Value Ref Range   Sodium 132 (L) 135 - 145 mmol/L   Potassium 3.5 3.5 - 5.1 mmol/L   Chloride 105 98 - 111 mmol/L   CO2 20 (L) 22 - 32 mmol/L   Glucose, Bld 105 (H) 70 - 99 mg/dL    Comment: Glucose reference range applies only to samples taken after fasting for at least 8 hours.   BUN 6 6 - 20 mg/dL   Creatinine, Ser 0.53 0.44 - 1.00 mg/dL   Calcium 8.0 (L) 8.9 - 10.3 mg/dL   Total Protein 6.8 6.5 - 8.1 g/dL   Albumin 3.0 (L) 3.5 - 5.0 g/dL   AST 26 15 - 41 U/L   ALT 32 0 - 44 U/L   Alkaline Phosphatase 78 38 - 126 U/L   Total Bilirubin 1.2 0.3 - 1.2 mg/dL   GFR, Estimated >60 >60 mL/min    Comment: (NOTE) Calculated using the CKD-EPI Creatinine Equation (2021)    Anion gap 7 5 - 15    Comment: Performed at [facility], [address], [place], [place] [address]  Urine Culture     Status: Abnormal (Preliminary result)    Collection Time: 03/08/22 12:14 PM   Specimen: In/Out Cath Urine  Result Value Ref Range   Specimen Description      IN/OUT CATH URINE Performed at [facility], [address] [place], [place] [address]    Special Requests      NONE Performed at [facility], [address] [place], [place] [address]    Culture (A)     >=100,000 COLONIES/mL GRAM NEGATIVE RODS 50,000 COLONIES/mL GRAM POSITIVE  COCCI IDENTIFICATION AND SUSCEPTIBILITIES TO FOLLOW Performed at [facility], [place] [address], [place], [place] [address]    Report Status PENDING   Urinalysis, Routine w reflex microscopic     Status: Abnormal   Collection Time: 03/08/22 12:14 PM  Result Value Ref Range   Color, Urine YELLOW (A) YELLOW   APPearance CLOUDY (A) CLEAR   Specific Gravity, Urine 1.014 1.005 - 1.030   pH 5.0 5.0 - 8.0   Glucose, UA NEGATIVE NEGATIVE mg/dL   Hgb urine dipstick NEGATIVE NEGATIVE   Bilirubin Urine NEGATIVE NEGATIVE   Ketones, ur 80 (A) NEGATIVE mg/dL   Protein, ur 100 (A) NEGATIVE mg/dL   Nitrite NEGATIVE NEGATIVE   Leukocytes,Ua LARGE (A) NEGATIVE   RBC / HPF >50 (H) 0 - 5 RBC/hpf   WBC, UA >50 (H) 0 - 5 WBC/hpf   Bacteria, UA RARE (A) NONE SEEN   Squamous Epithelial / LPF 0-5 0 - 5   WBC Clumps PRESENT    Mucus PRESENT     Comment: Performed at [facility], [address] [place], [place] [address]  Lactic acid, plasma     Status: None   Collection Time: 03/08/22  4:25 PM  Result Value Ref Range   Lactic Acid, Venous 1.0 0.5 - 1.9 mmol/L    Comment: Performed at [facility], [address] [place], [place] [address]  Blood Culture (routine x 2)     Status: None (Preliminary result)   Collection Time: 03/08/22  4:38 PM   Specimen: BLOOD RIGHT HAND  Result Value Ref Range   Specimen Description BLOOD RIGHT HAND    Special Requests      BOTTLES DRAWN AEROBIC AND ANAEROBIC Blood Culture adequate volume   Culture      NO GROWTH 2  DAYS Performed at [facility], [address] [place], [place] [address]    Report Status PENDING   Blood Culture (routine x 2)     Status: None (Preliminary result)   Collection Time: 03/08/22  4:38 PM   Specimen: Right Antecubital; Blood  Result Value Ref Range   Specimen Description RIGHT ANTECUBITAL    Special Requests      BOTTLES DRAWN AEROBIC AND ANAEROBIC Blood Culture adequate volume   Culture      NO GROWTH 2 DAYS Performed at [facility], [address] [place], [place] [address]    Report Status PENDING   Lactic acid, plasma     Status: None   Collection Time: 03/08/22  6:17 PM  Result Value Ref Range   Lactic Acid, Venous 1.8 0.5 - 1.9 mmol/L    Comment: Performed at [facility], [address] [place], [place] [address]  CBC     Status: Abnormal   Collection Time: 03/09/22  6:22 AM  Result Value Ref Range   WBC 11.8 (H) 4.0 - 10.5 K/uL   RBC 3.14 (L) 3.87 - 5.11 MIL/uL   Hemoglobin 8.6 (L) 12.0 - 15.0 g/dL   HCT 27.7 (L) 36.0 - 46.0 %   MCV 88.2 80.0 - 100.0 fL   MCH 27.4 26.0 - 34.0 pg   MCHC 31.0 30.0 - 36.0 g/dL   RDW 15.4 11.5 - 15.5 %   Platelets 218 150 - 400 K/uL   nRBC 0.0 0.0 - 0.2 %    Comment: Performed at [facility], [address], [place], [place] [address]  Comprehensive metabolic panel     Status:  Abnormal   Collection Time: 03/09/22  6:22 AM  Result Value Ref Range   Sodium 137 135 - 145 mmol/L   Potassium 3.6 3.5 - 5.1 mmol/L   Chloride 112 (H) 98 - 111 mmol/L   CO2 21 (L) 22 - 32 mmol/L   Glucose, Bld 94 70 - 99 mg/dL    Comment: Glucose reference range applies only to samples taken after fasting for at least 8 hours.   BUN <5 (L) 6 - 20 mg/dL   Creatinine, Ser 0.34 (L) 0.44 - 1.00 mg/dL   Calcium 7.6 (L) 8.9 - 10.3 mg/dL   Total Protein 5.5 (L) 6.5 - 8.1 g/dL   Albumin 2.3 (L) 3.5 - 5.0 g/dL   AST 20 15 - 41 U/L   ALT 22 0 - 44 U/L   Alkaline Phosphatase 55 38 - 126 U/L   Total  Bilirubin 1.0 0.3 - 1.2 mg/dL   GFR, Estimated >60 >60 mL/min    Comment: (NOTE) Calculated using the CKD-EPI Creatinine Equation (2021)    Anion gap 4 (L) 5 - 15    Comment: Performed at [facility], [address] [place], [place] [address]  CBC     Status: Abnormal   Collection Time: 03/10/22  5:21 AM  Result Value Ref Range   WBC 7.3 4.0 - 10.5 K/uL   RBC 3.12 (L) 3.87 - 5.11 MIL/uL   Hemoglobin 8.4 (L) 12.0 - 15.0 g/dL   HCT 26.5 (L) 36.0 - 46.0 %   MCV 84.9 80.0 - 100.0 fL   MCH 26.9 26.0 - 34.0 pg   MCHC 31.7 30.0 - 36.0 g/dL   RDW 15.6 (H) 11.5 - 15.5 %   Platelets 228 150 - 400 K/uL   nRBC 0.0 0.0 - 0.2 %    Comment: Performed at [facility], [address] [place], [place] [address]    [place] [employer]  Result Date: 03/08/2022 CLINICAL DATA:  Abdominal pain. EXAM: LIMITED OBSTETRIC ULTRASOUND COMPARISON:  None Available. FINDINGS: Number of Fetuses: 1 Heart Rate:  164 bpm Movement: Yes Presentation: Cephalic Placental Location: Posterior Previa: No Amniotic Fluid (Subjective):  Within normal limits. BPD: 5.1 cm 21 w  3 d MATERNAL FINDINGS: Cervix:  Appears closed. Uterus/Adnexae: No abnormality visualized. IMPRESSION: 1. Single viable intrauterine pregnancy with estimated gestational age [redacted] weeks 3 days. 2. Normal sonographic appearance of the ovaries. This exam is performed on an emergent basis and does not comprehensively evaluate fetal size, dating, or anatomy; follow-up complete OB US should be considered if further fetal assessment is warranted. Electronically Signed   By: Ileana Roup M.D.   On: 03/08/2022 16:46   US Renal  Result Date: 03/08/2022 CLINICAL DATA:  Right upper quadrant pain, right flank pain, abdominal pain and fever EXAM: RENAL / URINARY TRACT ULTRASOUND COMPLETE COMPARISON:  None Available. FINDINGS: Right Kidney: Renal measurements: 11.9 x 5.2 x 6.2 cm = volume: 203 mL. Echogenicity within normal limits. No mass visualized. Mild  right hydronephrosis and proximal hydroureter. Left Kidney: Renal measurements: 12.1 x 5.9 x 5.5 cm = volume: 204 mL. Echogenicity within normal limits. No mass or hydronephrosis visualized. Bladder: Appears normal for degree of bladder distention. Other: None. IMPRESSION: Mild right hydronephrosis and proximal hydroureter without calculi or other obstructing etiology identified by ultrasound. Consider CT or MRI to further evaluate. Electronically Signed   By: Delanna Ahmadi M.D.   On: 03/08/2022 16:45   US Abdomen Limited RUQ (LIVER/GB)  Result Date: 03/08/2022  CLINICAL DATA:  [id number] EXAM: ULTRASOUND ABDOMEN LIMITED RIGHT UPPER QUADRANT COMPARISON:  None Available. FINDINGS: Gallbladder: No gallstones or wall thickening visualized. No sonographic Murphy sign noted by sonographer. Common bile duct: Diameter: 6 mm. Liver: No focal lesion identified. Within normal limits in parenchymal echogenicity. Portal vein is patent on color Doppler imaging with normal direction of blood flow towards the liver. Other: None. IMPRESSION: Unremarkable right upper quadrant ultrasound. Electronically Signed   By: Iven Finn M.D.   On: 03/08/2022 16:44    Current scheduled medications  aspirin EC  81 mg Oral Daily   famotidine  20 mg Oral BID   folic acid  1 mg Oral Daily    I have reviewed the patient's current medications. Last fever was day of admission. 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

## 2022-03-10 NOTE — Consult Note (Signed)
NAME: Angelica Valenzuela  DOB: 18-May-1991  MRN: [id number]  Date/Time: 03/10/2022 2:42 PM  REQUESTING PROVIDER:Jennifer Oxley  Subjective:  REASON FOR CONSULT: pyelonephritis ? Angelica Valenzuela is a 31 y.o. female who is G3P2 and [redacted] weeks pregnant presented with rt sided flank pain- sHe starting having this pain a week ago. She then had vomiting and diarrhea which she thought was due to gastric bug as her neighbor had it. She came to the ED on 03/08/22 as she saw her urine was turning dark and also she was fatigued, had chills and fever  Patient has h/o raynauds and scleroderma, not taking any meds for that since her pregnancy. Used to be on Plaquenil  In the ED vitals  03/08/22 12:12  BP 109/55 !  Temp 100.2 F (37.9 C)  Pulse Rate 127 !  Resp 16  SpO2 96 %   Labs revealed  Latest Reference Range & Units 03/08/22 12:14  WBC 4.0 - 10.5 K/uL 15.6 (H)  Hemoglobin 12.0 - 15.0 g/dL 06.3 (L)  HCT 01.6 - 01.0 % 34.3 (L)  Platelets 150 - 400 K/uL 268  Creatinine 0.44 - 1.00 mg/dL 9.32   Blood and urine culture sent and she wa satarte don ceftriaxone US kidney showed mild rt hydronephrosis and rt hydroureter I am seeing the patient as urine culture is positive for Ecoli and staph epi Pt is having headaches Pain rt  back is present when coughing or deep breath   Past Medical History:  Diagnosis Date   Anxiety    Depression    Hx of scleroderma    Insomnia    Polycystic disease, ovaries    Raynaud's disease     Past Surgical History:  Procedure Laterality Date   ANKLE FRACTURE SURGERY Left    ESOPHAGOGASTRODUODENOSCOPY (EGD) WITH PROPOFOL N/A 03/04/2018   Procedure: ESOPHAGOGASTRODUODENOSCOPY (EGD) WITH PROPOFOL;  Surgeon: Wyline Mood, MD;  Location: [facility] ENDOSCOPY;  Service: Gastroenterology;  Laterality: N/A;    Social History   Socioeconomic History   Marital status: Married    Spouse name: Christain Duey   Number of children: Not on file   Years of education: Not on  file   Highest education level: Not on file  Occupational History   Not on file  Tobacco Use   Smoking status: Never   Smokeless tobacco: Never  Vaping Use   Vaping Use: Never used  Substance and Sexual Activity   Alcohol use: Not Currently   Drug use: Never   Sexual activity: Yes  Other Topics Concern   Not on file  Social History Narrative   Not on file   Social Determinants of Health   Financial Resource Strain: Not on file  Food Insecurity: No Food Insecurity (03/08/2022)   Hunger Vital Sign    Worried About Running Out of Food in the Last Year: Never true    Ran Out of Food in the Last Year: Never true  Transportation Needs: No Transportation Needs (03/08/2022)   PRAPARE - Administrator, Civil Service (Medical): No    Lack of Transportation (Non-Medical): No  Physical Activity: Not on file  Stress: Not on file  Social Connections: Not on file  Intimate Partner Violence: Not At Risk (03/08/2022)   Humiliation, Afraid, Rape, and Kick questionnaire    Fear of Current or Ex-Partner: No    Emotionally Abused: No    Physically Abused: No    Sexually Abused: No    Family History  Problem Relation Age  of Onset   Diabetes Father    Hypertension Sister    No Known Allergies I? Current Facility-Administered Medications  Medication Dose Route Frequency Provider Last Rate Last Admin   0.9 %  sodium chloride infusion   Intravenous PRN Chari Manning Rolla Plate, CNM 10 mL/hr at 03/10/22 1406 New Bag at 03/10/22 1406   acetaminophen (TYLENOL) tablet 1,000 mg  1,000 mg Oral Q6H PRN Gustavo Lah, CNM   1,000 mg at 03/10/22 1401   aspirin EC tablet 81 mg  81 mg Oral Daily Gustavo Lah, CNM   81 mg at 03/10/22 0240   calcium carbonate (TUMS - dosed in mg elemental calcium) chewable tablet 400 mg of elemental calcium  400 mg of elemental calcium Oral TID PRN Gustavo Lah, CNM       cefTRIAXone (ROCEPHIN) 2 g in sodium chloride 0.9 % 100 mL IVPB  2 g Intravenous  Q24H Margaretmary Eddy M, CNM 200 mL/hr at 03/10/22 1408 2 g at 03/10/22 1408   famotidine (PEPCID) tablet 20 mg  20 mg Oral BID Gustavo Lah, CNM   20 mg at 03/10/22 9735   folic acid (FOLVITE) tablet 1 mg  1 mg Oral Daily Gustavo Lah, CNM   1 mg at 03/10/22 3299   guaiFENesin (MUCINEX) 12 hr tablet 600 mg  600 mg Oral BID PRN Gustavo Lah, CNM   600 mg at 03/09/22 1729   menthol-cetylpyridinium (CEPACOL) lozenge 3 mg  1 lozenge Oral PRN Gustavo Lah, CNM   3 mg at 03/08/22 2238   ondansetron (ZOFRAN) injection 4 mg  4 mg Intravenous Q6H PRN Gustavo Lah, CNM       oxyCODONE (Oxy IR/ROXICODONE) immediate release tablet 5 mg  5 mg Oral Q4H PRN Gustavo Lah, CNM   5 mg at 03/08/22 2112     Abtx:  Anti-infectives (From admission, onward)    Start     Dose/Rate Route Frequency Ordered Stop   03/08/22 1430  cefTRIAXone (ROCEPHIN) 2 g in sodium chloride 0.9 % 100 mL IVPB        2 g 200 mL/hr over 30 Minutes Intravenous Every 24 hours 03/08/22 1425 03/15/22 1429       REVIEW OF SYSTEMS:  Const:  fever,  chills, negative weight loss Eyes: negative diplopia or visual changes, negative eye pain ENT: negative coryza, negative sore throat Resp: negative cough, hemoptysis, dyspnea Cards: negative for chest pain, palpitations, lower extremity edema GU: negative for frequency, dysuria and hematuria GI: rt flank pain Skin: negative for rash and pruritus Heme: negative for easy bruising and gum/nose bleeding MS: negative for myalgias, arthralgias, back pain and muscle weakness Neurolo:+headaches, +dizziness,  No vertigo, memory problems  Psych: negative for feelings of anxiety, depression  Endocrine: negative for thyroid, diabetes Allergy/Immunology- negative for any medication or food allergies  Objective:  VITALS:  BP (!) 91/53 (BP Location: Right Arm)   Pulse 100   Temp 97.8 F (36.6 C) (Oral)   Resp 17   Ht 5\' 5"  (1.651 m)   Wt 73 kg   LMP 10/19/2021 (Exact Date)   SpO2  100%   Breastfeeding No   BMI 26.79 kg/m   PHYSICAL EXAM:  General: Alert, cooperative, some distress due to headache, appears stated age.  Head: Normocephalic, without obvious abnormality, atraumatic. Eyes: Conjunctivae clear, anicteric sclerae. Pupils are equal ENT Nares normal. No drainage or sinus tenderness. Lips, mucosa, and tongue normal. No Thrush Neck: Supple, symmetrical,  no adenopathy, thyroid: non tender no carotid bruit and no JVD. Back: No CVA tenderness. Lungs: Clear to auscultation bilaterally. No Wheezing or Rhonchi. No rales. Heart: Regular rate and rhythm, no murmur, rub or gallop. Abdomen: uterus felt below umbilcius Rt CVA tenderness Extremities: scabs over finger tips and toes Skin: No rashes or lesions. Or bruising Lymph: Cervical, supraclavicular normal. Neurologic: Grossly non-focal Pertinent Labs Lab Results CBC    Component Value Date/Time   WBC 7.3 03/10/2022 0521   RBC 3.12 (L) 03/10/2022 0521   HGB 8.4 (L) 03/10/2022 0521   HGB 13.3 08/30/2011 1923   HCT 26.5 (L) 03/10/2022 0521   HCT 31.5 (L) 08/31/2011 0432   PLT 228 03/10/2022 0521   PLT 234 08/30/2011 1923   MCV 84.9 03/10/2022 0521   MCV 91 08/30/2011 1923   MCH 26.9 03/10/2022 0521   MCHC 31.7 03/10/2022 0521   RDW 15.6 (H) 03/10/2022 0521   RDW 13.3 08/30/2011 1923   LYMPHSABS 1.0 03/08/2022 1214   LYMPHSABS 2.5 08/30/2011 0423   MONOABS 1.0 03/08/2022 1214   MONOABS 0.7 08/30/2011 0423   EOSABS 0.1 03/08/2022 1214   EOSABS 0.2 08/30/2011 0423   BASOSABS 0.0 03/08/2022 1214   BASOSABS 0.0 08/30/2011 0423       Latest Ref Rng & Units 03/09/2022    6:22 AM 03/08/2022   12:14 PM 03/04/2018   10:42 AM  CMP  Glucose 70 - 99 mg/dL 94  076  99   BUN 6 - 20 mg/dL 5  6  9    Creatinine 0.44 - 1.00 mg/dL 2.26  3.33  5.45   Sodium 135 - 145 mmol/L 137  132  137   Potassium 3.5 - 5.1 mmol/L 3.6  3.5  5.3   Chloride 98 - 111 mmol/L 112  105  100   CO2 22 - 32 mmol/L 21  20  28     Calcium 8.9 - 10.3 mg/dL 7.6  8.0  9.1   Total Protein 6.5 - 8.1 g/dL 5.5  6.8  7.8   Total Bilirubin 0.3 - 1.2 mg/dL 1.0  1.2  1.8   Alkaline Phos 38 - 126 U/L 55  78  55   AST 15 - 41 U/L 20  26  26    ALT 0 - 44 U/L 22  32  54       Microbiology: Recent Results (from the past 240 hour(s))  Resp Panel by RT-PCR (Flu A&B, Covid) Anterior Nasal Swab     Status: None   Collection Time: 03/08/22 12:14 PM   Specimen: Anterior Nasal Swab  Result Value Ref Range Status   SARS Coronavirus 2 by RT PCR NEGATIVE NEGATIVE Final    Comment: (NOTE) SARS-CoV-2 target nucleic acids are NOT DETECTED.  The SARS-CoV-2 RNA is generally detectable in upper respiratory specimens during the acute phase of infection. The lowest concentration of SARS-CoV-2 viral copies this assay can detect is 138 copies/mL. A negative result does not preclude SARS-Cov-2 infection and should not be used as the sole basis for treatment or other patient management decisions. A negative result may occur with  improper specimen collection/handling, submission of specimen other than nasopharyngeal swab, presence of viral mutation(s) within the areas targeted by this assay, and inadequate number of viral copies(<138 copies/mL). A negative result must be combined with clinical observations, patient history, and epidemiological information. The expected result is Negative.  Fact Sheet for Patients:   Fact Sheet for Healthcare Providers:  05/08/22  This  test is no t yet approved or cleared by the [place] and  has been authorized for detection and/or diagnosis of SARS-CoV-2 by FDA under an Emergency Use Authorization (EUA). This EUA will remain  in effect (meaning this test can be used) for the duration of the COVID-19 declaration under Section 564(b)(1) of the Act, 21 U.S.C.section 360bbb-3(b)(1), unless the authorization is terminated   or revoked sooner.       Influenza A by PCR NEGATIVE NEGATIVE Final   Influenza B by PCR NEGATIVE NEGATIVE Final    Comment: (NOTE) The Xpert Xpress SARS-CoV-2/FLU/RSV plus assay is intended as an aid in the diagnosis of influenza from Nasopharyngeal swab specimens and should not be used as a sole basis for treatment. Nasal washings and aspirates are unacceptable for Xpert Xpress SARS-CoV-2/FLU/RSV testing.  Fact Sheet for Patients: BloggerCourse.com  Fact Sheet for Healthcare Providers: SeriousBroker.it  This test is not yet approved or cleared by the [place] FDA and has been authorized for detection and/or diagnosis of SARS-CoV-2 by FDA under an Emergency Use Authorization (EUA). This EUA will remain in effect (meaning this test can be used) for the duration of the COVID-19 declaration under Section 564(b)(1) of the Act, 21 U.S.C. section 360bbb-3(b)(1), unless the authorization is terminated or revoked.  Performed at [facility], [address] [place], [place] [address]   Urine Culture     Status: Abnormal   Collection Time: 03/08/22 12:14 PM   Specimen: In/Out Cath Urine  Result Value Ref Range Status   Specimen Description   Final    IN/OUT CATH URINE Performed at [facility], [address] [place], [place] [address]    Special Requests   Final    NONE Performed at [facility], [address] [place], [place] [address]    Culture (A)  Final    >=100,000 COLONIES/mL ESCHERICHIA COLI 20,000 COLONIES/mL STAPHYLOCOCCUS EPIDERMIDIS    Report Status 03/10/2022 FINAL  Final   Organism ID, Bacteria ESCHERICHIA COLI (A)  Final   Organism ID, Bacteria STAPHYLOCOCCUS EPIDERMIDIS (A)  Final      Susceptibility   Escherichia coli - MIC*    AMPICILLIN <=2 SENSITIVE Sensitive     CEFAZOLIN <=4 SENSITIVE Sensitive     CEFEPIME <=0.12 SENSITIVE Sensitive     CEFTRIAXONE <=0.25  SENSITIVE Sensitive     CIPROFLOXACIN <=0.25 SENSITIVE Sensitive     GENTAMICIN <=1 SENSITIVE Sensitive     IMIPENEM <=0.25 SENSITIVE Sensitive     NITROFURANTOIN <=16 SENSITIVE Sensitive     TRIMETH/SULFA <=20 SENSITIVE Sensitive     AMPICILLIN/SULBACTAM <=2 SENSITIVE Sensitive     PIP/TAZO <=4 SENSITIVE Sensitive     * >=100,000 COLONIES/mL ESCHERICHIA COLI   Staphylococcus epidermidis - MIC*    CIPROFLOXACIN <=0.5 SENSITIVE Sensitive     GENTAMICIN <=0.5 SENSITIVE Sensitive     NITROFURANTOIN <=16 SENSITIVE Sensitive     OXACILLIN >=4 RESISTANT Resistant     TETRACYCLINE >=16 RESISTANT Resistant     VANCOMYCIN 1 SENSITIVE Sensitive     TRIMETH/SULFA <=10 SENSITIVE Sensitive     CLINDAMYCIN >=8 RESISTANT Resistant     RIFAMPIN <=0.5 SENSITIVE Sensitive     Inducible Clindamycin NEGATIVE Sensitive     * 20,000 COLONIES/mL STAPHYLOCOCCUS EPIDERMIDIS  Blood Culture (routine x 2)     Status: None (Preliminary result)   Collection Time: 03/08/22  4:38 PM   Specimen: BLOOD RIGHT HAND  Result Value Ref Range Status   Specimen Description  BLOOD RIGHT HAND  Final   Special Requests   Final    BOTTLES DRAWN AEROBIC AND ANAEROBIC Blood Culture adequate volume   Culture   Final    NO GROWTH 2 DAYS Performed at [facility], [address] [place], [place] [address]    Report Status PENDING  Incomplete  Blood Culture (routine x 2)     Status: None (Preliminary result)   Collection Time: 03/08/22  4:38 PM   Specimen: Right Antecubital; Blood  Result Value Ref Range Status   Specimen Description RIGHT ANTECUBITAL  Final   Special Requests   Final    BOTTLES DRAWN AEROBIC AND ANAEROBIC Blood Culture adequate volume   Culture   Final    NO GROWTH 2 DAYS Performed at [facility], [address] [place], [place] [address]    Report Status PENDING  Incomplete    IMAGING RESULTS: US kidney reviewed- rt hydronephrosis and hydroureter I have personally reviewed the  films ? Impression/Recommendation ? ?Rt pyelonephritis in a patient who is [redacted] weeks pregnant who has some immune compromised state  and has mild rt hydroureteronephrosis No renal stone E.coli UT  Staph epidermidis in the urine is a likely contaminant 'pt is currently on ceftriaxone which is treating the e.coli but not the staph epidermidis- as no fever and leucocytosis has resolved it is the e.coli that is the culprit causing the infection and not staph epi Once she is afebrile off tylenol she could be switched to PO bactrim  ( if no contraindication) on discharge ( until 03/19/22). Further management as per OB  Scleroderma/raynauds- doing better-currently no meds Was on hydroxychloroquine  Anemia  Migraine ? ___________________________________________________ Discussed with patient, requesting provider Note:  This document was prepared using Dragon voice recognition software and may include unintentional dictation errors.

## 2022-03-11 ENCOUNTER — Inpatient Hospital Stay: Payer: Managed Care, Other (non HMO)

## 2022-03-11 DIAGNOSIS — B9620 Unspecified Escherichia coli [E. coli] as the cause of diseases classified elsewhere: Secondary | ICD-10-CM | POA: Diagnosis not present

## 2022-03-11 DIAGNOSIS — O2342 Unspecified infection of urinary tract in pregnancy, second trimester: Secondary | ICD-10-CM | POA: Diagnosis not present

## 2022-03-11 DIAGNOSIS — O2302 Infections of kidney in pregnancy, second trimester: Secondary | ICD-10-CM | POA: Diagnosis not present

## 2022-03-11 DIAGNOSIS — O99012 Anemia complicating pregnancy, second trimester: Secondary | ICD-10-CM | POA: Diagnosis not present

## 2022-03-11 LAB — CBC WITH DIFFERENTIAL/PLATELET
Abs Immature Granulocytes: 0.02 10*3/uL (ref 0.00–0.07)
Basophils Absolute: 0 10*3/uL (ref 0.0–0.1)
Basophils Relative: 1 %
Eosinophils Absolute: 0.3 10*3/uL (ref 0.0–0.5)
Eosinophils Relative: 5 %
HCT: 27.4 % — ABNORMAL LOW (ref 36.0–46.0)
Hemoglobin: 8.8 g/dL — ABNORMAL LOW (ref 12.0–15.0)
Immature Granulocytes: 0 %
Lymphocytes Relative: 26 %
Lymphs Abs: 1.5 10*3/uL (ref 0.7–4.0)
MCH: 27.3 pg (ref 26.0–34.0)
MCHC: 32.1 g/dL (ref 30.0–36.0)
MCV: 85.1 fL (ref 80.0–100.0)
Monocytes Absolute: 0.6 10*3/uL (ref 0.1–1.0)
Monocytes Relative: 10 %
Neutro Abs: 3.3 10*3/uL (ref 1.7–7.7)
Neutrophils Relative %: 58 %
Platelets: 275 10*3/uL (ref 150–400)
RBC: 3.22 MIL/uL — ABNORMAL LOW (ref 3.87–5.11)
RDW: 15.4 % (ref 11.5–15.5)
WBC: 5.7 10*3/uL (ref 4.0–10.5)
nRBC: 0 % (ref 0.0–0.2)

## 2022-03-11 MED ORDER — MICONAZOLE NITRATE 100 MG VA SUPP
100.0000 mg | Freq: Every day | VAGINAL | Status: DC
  Administered 2022-03-11: 100 mg via VAGINAL
  Filled 2022-03-11: qty 1

## 2022-03-11 MED ORDER — BUTALBITAL-APAP-CAFFEINE 50-325-40 MG PO TABS
1.0000 | ORAL_TABLET | Freq: Once | ORAL | Status: AC
  Administered 2022-03-11: 1 via ORAL
  Filled 2022-03-11: qty 1

## 2022-03-11 NOTE — Progress Notes (Signed)
Pt complains of "wooziness" and nausea while sitting in the bed. Attempting to eat and stopped. Vital signs were stable. CMN notified. See new orders.

## 2022-03-11 NOTE — Progress Notes (Signed)
New IV placed by IV team at 1600.   IV rocephin started at 1658.

## 2022-03-11 NOTE — Progress Notes (Signed)
ANTEPARTUM PROGRESS NOTE  Angelica Valenzuela is a 31 y.o. G3P2002 at [redacted]w[redacted]d who is admitted for pyelonephritis.  Estimated Date of Delivery: 07/26/22  Length of Stay:  3 Days. Admitted 03/08/2022  Subjective: She states she is feeling much better today since she does not have a fever or pain. However she reports some pain with breathing and taking deep breaths. She also reports a migraine x 3 days that has not improved. Patient reports good fetal movement.  She reports no uterine contractions, no bleeding and no loss of fluid per vagina.  Vitals:  Blood pressure (!) 96/57, pulse 86, temperature 98.7 F (37.1 C), temperature source Oral, resp. rate 20, height 5\' 5"  (1.651 m), weight 73 kg, last menstrual period 10/19/2021, SpO2 98 %, not currently breastfeeding.  Afebrile since 03/09/22 @ 2010 Physical Examination: CONSTITUTIONAL: Well-developed, well-nourished female in no acute distress.  HENT:  Normocephalic, atraumatic, External right and left ear normal. Oropharynx is clear and moist EYES: Conjunctivae and EOM are normal. Pupils are equal, round, and reactive to light. No scleral icterus.  NECK: Normal range of motion, supple, no masses SKIN: Skin is warm and dry. No rash noted. Not diaphoretic. No erythema. No pallor. [place]: Alert and oriented to person, place, and time. Normal reflexes, muscle tone coordination. No cranial nerve deficit noted. PSYCHIATRIC: Normal mood and affect. Normal behavior. Normal judgment and thought content. CARDIOVASCULAR: Normal heart rate noted, regular rhythm RESPIRATORY: Effort normal, lungs clear, +diminished lung sounds noted in bilateral lower lung bases, right more diminished than left MUSCULOSKELETAL: Normal range of motion. No edema and no tenderness. 2+ distal pulses. ABDOMEN: Soft, nontender, nondistended, gravid. CERVIX:   deferred  Results for orders placed or performed during the hospital encounter of 03/08/22 (from the past 48 hour(s))  CBC      Status: Abnormal   Collection Time: 03/10/22  5:21 AM  Result Value Ref Range   WBC 7.3 4.0 - 10.5 K/uL   RBC 3.12 (L) 3.87 - 5.11 MIL/uL   Hemoglobin 8.4 (L) 12.0 - 15.0 g/dL   HCT 26.5 (L) 36.0 - 46.0 %   MCV 84.9 80.0 - 100.0 fL   MCH 26.9 26.0 - 34.0 pg   MCHC 31.7 30.0 - 36.0 g/dL   RDW 15.6 (H) 11.5 - 15.5 %   Platelets 228 150 - 400 K/uL   nRBC 0.0 0.0 - 0.2 %    Comment: Performed at [facility], [address] [place], [place] [address]  CBC with Differential/Platelet     Status: Abnormal   Collection Time: 03/11/22  5:47 AM  Result Value Ref Range   WBC 5.7 4.0 - 10.5 K/uL   RBC 3.22 (L) 3.87 - 5.11 MIL/uL   Hemoglobin 8.8 (L) 12.0 - 15.0 g/dL   HCT 27.4 (L) 36.0 - 46.0 %   MCV 85.1 80.0 - 100.0 fL   MCH 27.3 26.0 - 34.0 pg   MCHC 32.1 30.0 - 36.0 g/dL   RDW 15.4 11.5 - 15.5 %   Platelets 275 150 - 400 K/uL   nRBC 0.0 0.0 - 0.2 %   Neutrophils Relative % 58 %   Neutro Abs 3.3 1.7 - 7.7 K/uL   Lymphocytes Relative 26 %   Lymphs Abs 1.5 0.7 - 4.0 K/uL   Monocytes Relative 10 %   Monocytes Absolute 0.6 0.1 - 1.0 K/uL   Eosinophils Relative 5 %   Eosinophils Absolute 0.3 0.0 - 0.5 K/uL   Basophils Relative 1 %  Basophils Absolute 0.0 0.0 - 0.1 K/uL   Immature Granulocytes 0 %   Abs Immature Granulocytes 0.02 0.00 - 0.07 K/uL    Comment: Performed at [facility], [address] [place], [place] [address]    No results found.  Current scheduled medications  aspirin EC  81 mg Oral Daily   butalbital-acetaminophen-caffeine  1 tablet Oral Once   famotidine  20 mg Oral BID   folic acid  1 mg Oral Daily    I have reviewed the patient's current medications.  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 Coli. Sensitive to rocephin. - Continue routine antenatal care until at least 48hrs afebrile, then discharge home with antibiotics - reviewed plan of care with Dr. Glennon Mac.  Gertie Fey, CNM 03/11/2022 9:01 AM

## 2022-03-11 NOTE — Progress Notes (Signed)
Pt remained afebrile on this RN's shift without the use of tylenol. Tmax of 99.0 F.

## 2022-03-11 NOTE — Progress Notes (Signed)
Both IV's are leaking around site and not infusing. Both removed.   Started IV rocephin but the other IV began leaking even though it flushed well.   IV team consulted.

## 2022-03-11 NOTE — Progress Notes (Signed)
Pt went down with transport and had chest x-ray completed. Back in room at 0923.

## 2022-03-11 NOTE — Progress Notes (Signed)
   Date of Admission:  03/08/2022     ID: Angelica Valenzuela is a 31 y.o. female Principal Problem:   Pyelonephritis complicating pregnancy    Subjective: Doing much better No headache No fever Has some pain rt flank area  Medications:   aspirin EC  81 mg Oral Daily   famotidine  20 mg Oral BID   folic acid  1 mg Oral Daily   miconazole  100 mg Vaginal QHS    Objective: Vital signs in last 24 hours: Temp:  [97.8 F (36.6 C)-99 F (37.2 C)] 98 F (36.7 C) (10/04 1518) Pulse Rate:  [76-86] 82 (10/04 1518) Resp:  [16-20] 18 (10/04 1518) BP: (86-109)/(51-73) 109/67 (10/04 1518) SpO2:  [91 %-100 %] 100 % (10/04 1518)    PHYSICAL EXAM:  General: Alert, cooperative, no distress, appears stated age.  Head: Normocephalic, without obvious abnormality, atraumatic. Eyes: Conjunctivae clear, anicteric sclerae. Pupils are equal ENT Nares normal. No drainage or sinus tenderness. Lips, mucosa, and tongue normal. No Thrush Neck: Supple, symmetrical, no adenopathy, thyroid: non tender no carotid bruit and no JVD. Back: No CVA tenderness. Lungs: Clear to auscultation bilaterally. No Wheezing or Rhonchi. No rales. Pain rt costovertebral area on deep inspiration Heart: Regular rate and rhythm, no murmur, rub or gallop. Abdomen: Soft, non-tender,not distended. Bowel sounds normal. No masses Minimla tenderness rt CVA Extremities: atraumatic, no cyanosis. No edema. No clubbing Skin: No rashes or lesions. Or bruising Lymph: Cervical, supraclavicular normal. Neurologic: Grossly non-focal  Lab Results Recent Labs    03/09/22 0622 03/10/22 0521 03/11/22 0547  WBC 11.8* 7.3 5.7  HGB 8.6* 8.4* 8.8*  HCT 27.7* 26.5* 27.4*  NA 137  --   --   K 3.6  --   --   CL 112*  --   --   CO2 21*  --   --   BUN <5*  --   --   CREATININE 0.34*  --   --    Liver Panel Recent Labs    03/09/22 0622  PROT 5.5*  ALBUMIN 2.3*  AST 20  ALT 22  ALKPHOS 54  BILITOT 1.0    Microbiology: 10/1  BC- NG 10/1 UC- ecoli  Staph epi  Studies/Results:   CXR no abnormality  Assessment/Plan: Complicated UTI with ecoli- has rt pyelonephritis due to hydronephrosis and ureter in pregnancy of 20 weeks No renal stone Aftebile X 48 hrs Leucocytosis has resolved On ceftriaxone Antibiotics for a total of 10 days On discharge bactrim DS 1 bid until 03/19/22 Further management as per OB  Scleroderma/raynauds quiescent  Anemia  Discussed the management with aptient and her nurse ID will sign off- call if needed

## 2022-03-12 MED ORDER — SULFAMETHOXAZOLE-TRIMETHOPRIM 800-160 MG PO TABS: 1.0000 | ORAL_TABLET | Freq: Two times a day (BID) | ORAL | 0 refills | Status: DC

## 2022-03-12 MED ORDER — FERROUS SULFATE 325 (65 FE) MG PO TABS: 325.0000 mg | ORAL_TABLET | Freq: Two times a day (BID) | ORAL | 6 refills | Status: AC

## 2022-03-12 NOTE — Progress Notes (Signed)
Discharge instructions, appointments, prescriptions given and explained. Pt verbalized understanding with no further questions. Pt awaiting ride to d/c home with family.

## 2022-03-12 NOTE — Progress Notes (Signed)
Pt wheeled to personal vehicle via this RN for d/c home  Mammie Lorenzo, RN

## 2022-03-12 NOTE — Discharge Summary (Signed)
Patient ID: Talyia Allende MRN: [id number] DOB/AGE: [birth year] 32 y.o.  Admit date: 03/08/2022 Discharge date: 03/12/2022  Admission Diagnoses: Pyelonephritis [N12] Pyelonephritis complicating pregnancy [O23.00] Abdominal pain affecting pregnancy [O26.899, R10.9] Sepsis without acute organ dysfunction, due to unspecified organism [facility]) [A41.9]  Discharge Diagnoses: same, anemia in pregnancy   Prenatal Care Site: [facility]  Prenatal Procedures: ultrasound   Significant Diagnostic Studies:  Results for orders placed or performed during the hospital encounter of 03/08/22 (from the past 168 hour(s))  Resp Panel by RT-PCR (Flu A&B, Covid) Anterior Nasal Swab   Collection Time: 03/08/22 12:14 PM   Specimen: Anterior Nasal Swab  Result Value Ref Range   SARS Coronavirus 2 by RT PCR NEGATIVE NEGATIVE   Influenza A by PCR NEGATIVE NEGATIVE   Influenza B by PCR NEGATIVE NEGATIVE  Urine Culture   Collection Time: 03/08/22 12:14 PM   Specimen: In/Out Cath Urine  Result Value Ref Range   Specimen Description      IN/OUT CATH URINE Performed at [facility], [address] [place], [place] [address]    Special Requests      NONE Performed at [facility], [address] [place], [place] [address]    Culture (A)     >=100,000 COLONIES/mL ESCHERICHIA COLI 20,000 COLONIES/mL STAPHYLOCOCCUS EPIDERMIDIS    Report Status 03/10/2022 FINAL    Organism ID, Bacteria ESCHERICHIA COLI (A)    Organism ID, Bacteria STAPHYLOCOCCUS EPIDERMIDIS (A)       Susceptibility   Escherichia coli - MIC*    AMPICILLIN <=2 SENSITIVE Sensitive     CEFAZOLIN <=4 SENSITIVE Sensitive     CEFEPIME <=0.12 SENSITIVE Sensitive     CEFTRIAXONE <=0.25 SENSITIVE Sensitive     CIPROFLOXACIN <=0.25 SENSITIVE Sensitive     GENTAMICIN <=1 SENSITIVE Sensitive     IMIPENEM <=0.25 SENSITIVE Sensitive     NITROFURANTOIN <=16 SENSITIVE Sensitive     TRIMETH/SULFA <=20 SENSITIVE Sensitive      AMPICILLIN/SULBACTAM <=2 SENSITIVE Sensitive     PIP/TAZO <=4 SENSITIVE Sensitive     * >=100,000 COLONIES/mL ESCHERICHIA COLI   Staphylococcus epidermidis - MIC*    CIPROFLOXACIN <=0.5 SENSITIVE Sensitive     GENTAMICIN <=0.5 SENSITIVE Sensitive     NITROFURANTOIN <=16 SENSITIVE Sensitive     OXACILLIN >=4 RESISTANT Resistant     TETRACYCLINE >=16 RESISTANT Resistant     VANCOMYCIN 1 SENSITIVE Sensitive     TRIMETH/SULFA <=10 SENSITIVE Sensitive     CLINDAMYCIN >=8 RESISTANT Resistant     RIFAMPIN <=0.5 SENSITIVE Sensitive     Inducible Clindamycin NEGATIVE Sensitive     * 20,000 COLONIES/mL STAPHYLOCOCCUS EPIDERMIDIS  CBC with Differential   Collection Time: 03/08/22 12:14 PM  Result Value Ref Range   WBC 15.6 (H) 4.0 - 10.5 K/uL   RBC 4.05 3.87 - 5.11 MIL/uL   Hemoglobin 10.9 (L) 12.0 - 15.0 g/dL   HCT 97.4 (L) 16.3 - 84.5 %   MCV 84.7 80.0 - 100.0 fL   MCH 26.9 26.0 - 34.0 pg   MCHC 31.8 30.0 - 36.0 g/dL   RDW 36.4 68.0 - 32.1 %   Platelets 268 150 - 400 K/uL   nRBC 0.0 0.0 - 0.2 %   Neutrophils Relative % 88 %   Neutro Abs 13.5 (H) 1.7 - 7.7 K/uL   Lymphocytes Relative 6 %   Lymphs Abs 1.0 0.7 - 4.0 K/uL   Monocytes Relative 6 %   Monocytes Absolute 1.0 0.1 - 1.0 K/uL  Eosinophils Relative 0 %   Eosinophils Absolute 0.1 0.0 - 0.5 K/uL   Basophils Relative 0 %   Basophils Absolute 0.0 0.0 - 0.1 K/uL   Immature Granulocytes 0 %   Abs Immature Granulocytes 0.07 0.00 - 0.07 K/uL  Comprehensive metabolic panel   Collection Time: 03/08/22 12:14 PM  Result Value Ref Range   Sodium 132 (L) 135 - 145 mmol/L   Potassium 3.5 3.5 - 5.1 mmol/L   Chloride 105 98 - 111 mmol/L   CO2 20 (L) 22 - 32 mmol/L   Glucose, Bld 105 (H) 70 - 99 mg/dL   BUN 6 6 - 20 mg/dL   Creatinine, Ser 6.28 0.44 - 1.00 mg/dL   Calcium 8.0 (L) 8.9 - 10.3 mg/dL   Total Protein 6.8 6.5 - 8.1 g/dL   Albumin 3.0 (L) 3.5 - 5.0 g/dL   AST 26 15 - 41 U/L   ALT 32 0 - 44 U/L   Alkaline Phosphatase 78  38 - 126 U/L   Total Bilirubin 1.2 0.3 - 1.2 mg/dL   GFR, Estimated >31 >51 mL/min   Anion gap 7 5 - 15  Urinalysis, Routine w reflex microscopic   Collection Time: 03/08/22 12:14 PM  Result Value Ref Range   Color, Urine YELLOW (A) YELLOW   APPearance CLOUDY (A) CLEAR   Specific Gravity, Urine 1.014 1.005 - 1.030   pH 5.0 5.0 - 8.0   Glucose, UA NEGATIVE NEGATIVE mg/dL   Hgb urine dipstick NEGATIVE NEGATIVE   Bilirubin Urine NEGATIVE NEGATIVE   Ketones, ur 80 (A) NEGATIVE mg/dL   Protein, ur 761 (A) NEGATIVE mg/dL   Nitrite NEGATIVE NEGATIVE   Leukocytes,Ua LARGE (A) NEGATIVE   RBC / HPF >50 (H) 0 - 5 RBC/hpf   WBC, UA >50 (H) 0 - 5 WBC/hpf   Bacteria, UA RARE (A) NONE SEEN   Squamous Epithelial / LPF 0-5 0 - 5   WBC Clumps PRESENT    Mucus PRESENT   Lactic acid, plasma   Collection Time: 03/08/22  4:25 PM  Result Value Ref Range   Lactic Acid, Venous 1.0 0.5 - 1.9 mmol/L  Blood Culture (routine x 2)   Collection Time: 03/08/22  4:38 PM   Specimen: BLOOD RIGHT HAND  Result Value Ref Range   Specimen Description BLOOD RIGHT HAND    Special Requests      BOTTLES DRAWN AEROBIC AND ANAEROBIC Blood Culture adequate volume   Culture      NO GROWTH 4 DAYS Performed at [facility], [address] [place], [place] [address]    Report Status PENDING   Blood Culture (routine x 2)   Collection Time: 03/08/22  4:38 PM   Specimen: Right Antecubital; Blood  Result Value Ref Range   Specimen Description RIGHT ANTECUBITAL    Special Requests      BOTTLES DRAWN AEROBIC AND ANAEROBIC Blood Culture adequate volume   Culture      NO GROWTH 4 DAYS Performed at [facility], [address], [place], [place] [address]    Report Status PENDING   Lactic acid, plasma   Collection Time: 03/08/22  6:17 PM  Result Value Ref Range   Lactic Acid, Venous 1.8 0.5 - 1.9 mmol/L  CBC   Collection Time: 03/09/22  6:22 AM  Result Value Ref Range   WBC 11.8 (H) 4.0 - 10.5  K/uL   RBC 3.14 (L) 3.87 - 5.11 MIL/uL   Hemoglobin 8.6 (L) 12.0 -  15.0 g/dL   HCT 94.1 (L) 74.0 - 81.4 %   MCV 88.2 80.0 - 100.0 fL   MCH 27.4 26.0 - 34.0 pg   MCHC 31.0 30.0 - 36.0 g/dL   RDW 48.1 85.6 - 31.4 %   Platelets 218 150 - 400 K/uL   nRBC 0.0 0.0 - 0.2 %  Comprehensive metabolic panel   Collection Time: 03/09/22  6:22 AM  Result Value Ref Range   Sodium 137 135 - 145 mmol/L   Potassium 3.6 3.5 - 5.1 mmol/L   Chloride 112 (H) 98 - 111 mmol/L   CO2 21 (L) 22 - 32 mmol/L   Glucose, Bld 94 70 - 99 mg/dL   BUN <5 (L) 6 - 20 mg/dL   Creatinine, Ser 9.70 (L) 0.44 - 1.00 mg/dL   Calcium 7.6 (L) 8.9 - 10.3 mg/dL   Total Protein 5.5 (L) 6.5 - 8.1 g/dL   Albumin 2.3 (L) 3.5 - 5.0 g/dL   AST 20 15 - 41 U/L   ALT 22 0 - 44 U/L   Alkaline Phosphatase 55 38 - 126 U/L   Total Bilirubin 1.0 0.3 - 1.2 mg/dL   GFR, Estimated >26 >37 mL/min   Anion gap 4 (L) 5 - 15  CBC   Collection Time: 03/10/22  5:21 AM  Result Value Ref Range   WBC 7.3 4.0 - 10.5 K/uL   RBC 3.12 (L) 3.87 - 5.11 MIL/uL   Hemoglobin 8.4 (L) 12.0 - 15.0 g/dL   HCT 85.8 (L) 85.0 - 27.7 %   MCV 84.9 80.0 - 100.0 fL   MCH 26.9 26.0 - 34.0 pg   MCHC 31.7 30.0 - 36.0 g/dL   RDW 41.2 (H) 87.8 - 67.6 %   Platelets 228 150 - 400 K/uL   nRBC 0.0 0.0 - 0.2 %  CBC with Differential/Platelet   Collection Time: 03/11/22  5:47 AM  Result Value Ref Range   WBC 5.7 4.0 - 10.5 K/uL   RBC 3.22 (L) 3.87 - 5.11 MIL/uL   Hemoglobin 8.8 (L) 12.0 - 15.0 g/dL   HCT 72.0 (L) 94.7 - 09.6 %   MCV 85.1 80.0 - 100.0 fL   MCH 27.3 26.0 - 34.0 pg   MCHC 32.1 30.0 - 36.0 g/dL   RDW 28.3 66.2 - 94.7 %   Platelets 275 150 - 400 K/uL   nRBC 0.0 0.0 - 0.2 %   Neutrophils Relative % 58 %   Neutro Abs 3.3 1.7 - 7.7 K/uL   Lymphocytes Relative 26 %   Lymphs Abs 1.5 0.7 - 4.0 K/uL   Monocytes Relative 10 %   Monocytes Absolute 0.6 0.1 - 1.0 K/uL   Eosinophils Relative 5 %   Eosinophils Absolute 0.3 0.0 - 0.5 K/uL   Basophils Relative  1 %   Basophils Absolute 0.0 0.0 - 0.1 K/uL   Immature Granulocytes 0 %   Abs Immature Granulocytes 0.02 0.00 - 0.07 K/uL    Treatments: IV hydration, IV Iron infusion, and antibiotics: Rocephin  Hospital Course:  Angelica Valenzuela is 31 y.o. G3P2002 at [redacted]w[redacted]d who was admitted for acute pyelonephritis.  She was started on IV Rocephin, also received IV hydration.   Patient was afebrile for 48 hours with the use of antipyretics prior to discharge and her CVAT improved significantly with oral analgesia.   Her antibiotic regimen was switched to oral Bactrim DS based on susceptibility on urine culture.  Urine culture results showed E. Coli and Staphylococcus epidermidis.  She was deemed stable for discharge to home today.    Discharge Physical Exam:  BP (!) 97/52 (BP Location: Right Arm) Comment: nurse Chelsea notified  Pulse 75   Temp 98.1 F (36.7 C) (Oral)   Resp 20   Ht 5\' 5"  (1.651 m)   Wt 73 kg   LMP 10/19/2021 (Exact Date)   SpO2 100%   Breastfeeding No   BMI 26.79 kg/m   General: NAD CV: RRR Pulm: nl effort ABD: s/nd/nt, gravid DVT Evaluation: LE non-ttp, no evidence of DVT on exam.  Discharge Condition: Stable  Disposition: Discharge disposition: 01-Home or Self Care       Allergies as of 03/12/2022   No Known Allergies      Medication List     STOP taking these medications    Vasculera Tabs       TAKE these medications    [facility] Aspirin Low Dose 81 MG tablet Generic drug: aspirin EC Take 81 mg by mouth daily.   Diclegis 10-10 MG Tbec Generic drug: Doxylamine-Pyridoxine Take by mouth.   famotidine 40 MG tablet Commonly known as: PEPCID Take 40 mg by mouth at bedtime.   ferrous sulfate 325 (65 FE) MG tablet Take 1 tablet (325 mg total) by mouth 2 (two) times daily with a meal.   folic acid 1 MG tablet Commonly known as: FOLVITE Take 1 tablet by mouth daily.   GaviLAX 17 GM/SCOOP powder Generic drug: polyethylene glycol powder Take 17 g by  mouth daily.   hydroxychloroquine 200 MG tablet Commonly known as: PLAQUENIL Take 200 mg by mouth.   PR Natal 400 29-1-200 & 400 MG Misc Take 1 tablet by mouth daily.   sulfamethoxazole-trimethoprim 800-160 MG tablet Commonly known as: BACTRIM DS Take 1 tablet by mouth 2 (two) times daily.        Follow-up [facility]. Schedule an appointment as soon as possible for a visit in 1 week(s).   Why: routine prenatal visit Contact information: [address] [place] [phone]        Gertie Fey, CNM. Go on 03/23/2022.   Specialty: Certified Nurse Midwife Why: keep follow up appointment Contact information: [place] [address] [phone]                 Signed:  Minda Meo 03/12/2022 6:51 PM ----- Drinda Butts, CNM Certified Nurse Midwife [facility]

## 2022-03-13 ENCOUNTER — Telehealth: Payer: Self-pay | Admitting: Obstetrics and Gynecology

## 2022-03-13 LAB — CULTURE, BLOOD (ROUTINE X 2)
Culture: NO GROWTH
Culture: NO GROWTH
Special Requests: ADEQUATE
Special Requests: ADEQUATE

## 2022-03-13 NOTE — Telephone Encounter (Signed)
TC from pt regarding anitbiotics prescribed yesterday at discharge for pyelonephritis, 20.[redacted]wks pregnant.   Pt states [facility] pharmacist told her it was unsafe and would cause birth defects.   Reviewed data with pt that 2nd trimester use of Bactrim DS is appropriate and safe for pregnancy, her urine culture is sensitive to this Abx and she should complete course as prescribed.    Francetta Found, CNM 03/13/2022 8:11 PM

## 2022-06-08 NOTE — L&D Delivery Note (Signed)
Date of delivery: 08/01/2022  Estimated Date of Delivery: 07/26/22 Patient's last menstrual period was 10/19/2021 (exact date). EGA: [redacted]w[redacted]d Delivery Note At 5:19 AM a viable female was delivered via Vaginal, Spontaneous (Presentation:  LOA    ).  APGAR: 8, 9; weight  pending.   Placenta status: Spontaneous, Intact.  Cord: 3 vessels with the following complications: None.    Anesthesia: None Episiotomy: None Lacerations: None  Est. Blood Loss (mL): 150  Mom to postpartum.  Baby to Couplet care / Skin to Skin.  Pt presented to L&D with active labor, noted to be complete and while in hands and knees position, pushed with 2 UCs to spontaneous delivery of fetal head in LOA position with restitution to LOT; Nuchal cord x 1 noted, infant delivered through cord.  Anterior then posterior shoulders delivered easily with gentle downward traction.  Baby placed on mom's chest, and attended to by peds.  Cord was doubly clamped and cut when pulseless.  Placenta spontaneously delivered, intact.  IM pitocin and Cytotec 8071m PR given for hemorrhage prophylaxis. Perineum, vagina and cervix were inspected and noted to be intact, Fundus was noted to be firm with small amt lochia. Mother and infant remain in birthing suite in stable condition.   ReMurray HodgkinscVey 08/01/2022, 5:47 AM

## 2022-08-01 ENCOUNTER — Other Ambulatory Visit: Payer: Self-pay

## 2022-08-01 ENCOUNTER — Inpatient Hospital Stay
Admission: EM | Admit: 2022-08-01 | Discharge: 2022-08-03 | DRG: 806 | Disposition: A | Discharge status: Home / Self Care | Payer: Medicaid Other | Attending: Obstetrics | Admitting: Obstetrics

## 2022-08-01 ENCOUNTER — Encounter: Payer: Self-pay | Admitting: Obstetrics and Gynecology

## 2022-08-01 DIAGNOSIS — D509 Iron deficiency anemia, unspecified: Secondary | ICD-10-CM | POA: Diagnosis present

## 2022-08-01 DIAGNOSIS — Z3A40 40 weeks gestation of pregnancy: Secondary | ICD-10-CM | POA: Diagnosis not present

## 2022-08-01 DIAGNOSIS — M349 Systemic sclerosis, unspecified: Secondary | ICD-10-CM | POA: Diagnosis present

## 2022-08-01 DIAGNOSIS — O9902 Anemia complicating childbirth: Secondary | ICD-10-CM | POA: Diagnosis present

## 2022-08-01 DIAGNOSIS — M069 Rheumatoid arthritis, unspecified: Secondary | ICD-10-CM | POA: Diagnosis present

## 2022-08-01 DIAGNOSIS — D62 Acute posthemorrhagic anemia: Secondary | ICD-10-CM | POA: Diagnosis present

## 2022-08-01 DIAGNOSIS — O480 Post-term pregnancy: Principal | ICD-10-CM | POA: Diagnosis present

## 2022-08-01 DIAGNOSIS — O99354 Diseases of the nervous system complicating childbirth: Secondary | ICD-10-CM | POA: Diagnosis present

## 2022-08-01 LAB — CBC
HCT: 37.1 % (ref 36.0–46.0)
Hemoglobin: 12.1 g/dL (ref 12.0–15.0)
MCH: 29.1 pg (ref 26.0–34.0)
MCHC: 32.6 g/dL (ref 30.0–36.0)
MCV: 89.2 fL (ref 80.0–100.0)
Platelets: 265 10*3/uL (ref 150–400)
RBC: 4.16 MIL/uL (ref 3.87–5.11)
RDW: 13.5 % (ref 11.5–15.5)
WBC: 10.1 10*3/uL (ref 4.0–10.5)
nRBC: 0 % (ref 0.0–0.2)

## 2022-08-01 LAB — COMPREHENSIVE METABOLIC PANEL
ALT: 24 U/L (ref 0–44)
AST: 28 U/L (ref 15–41)
Albumin: 3 g/dL — ABNORMAL LOW (ref 3.5–5.0)
Alkaline Phosphatase: 109 U/L (ref 38–126)
Anion gap: 9 (ref 5–15)
BUN: 9 mg/dL (ref 6–20)
CO2: 21 mmol/L — ABNORMAL LOW (ref 22–32)
Calcium: 8.5 mg/dL — ABNORMAL LOW (ref 8.9–10.3)
Chloride: 107 mmol/L (ref 98–111)
Creatinine, Ser: 0.52 mg/dL (ref 0.44–1.00)
GFR, Estimated: >60 mL/min (ref >60)
Glucose, Bld: 91 mg/dL (ref 70–99)
Potassium: 3.6 mmol/L (ref 3.5–5.1)
Sodium: 137 mmol/L (ref 135–145)
Total Bilirubin: 0.4 mg/dL (ref 0.3–1.2)
Total Protein: 6.7 g/dL (ref 6.5–8.1)

## 2022-08-01 LAB — TYPE AND SCREEN
ABO/RH(D): A POS
Antibody Screen: NEGATIVE

## 2022-08-01 LAB — RPR: RPR Ser Ql: NONREACTIVE

## 2022-08-01 MED ORDER — LACTATED RINGERS IV SOLN: INTRAVENOUS | Status: DC

## 2022-08-01 MED ORDER — WITCH HAZEL-GLYCERIN EX PADS
1.0000 | MEDICATED_PAD | CUTANEOUS | Status: DC | PRN
  Filled 2022-08-01: qty 100

## 2022-08-01 MED ORDER — ACETAMINOPHEN 325 MG PO TABS
650.0000 mg | ORAL_TABLET | ORAL | Status: DC | PRN
  Administered 2022-08-01 – 2022-08-03 (×3): 650 mg via ORAL
  Filled 2022-08-01 (×4): qty 2

## 2022-08-01 MED ORDER — BENZOCAINE-MENTHOL 20-0.5 % EX AERO
INHALATION_SPRAY | CUTANEOUS | Status: AC
  Filled 2022-08-01: qty 56

## 2022-08-01 MED ORDER — OXYTOCIN BOLUS FROM INFUSION: 333.0000 mL | Freq: Once | INTRAVENOUS | Status: DC

## 2022-08-01 MED ORDER — OXYTOCIN-SODIUM CHLORIDE 30-0.9 UT/500ML-% IV SOLN: 2.5000 [IU]/h | INTRAVENOUS | Status: DC

## 2022-08-01 MED ORDER — ACETAMINOPHEN 325 MG PO TABS
ORAL_TABLET | ORAL | Status: AC
  Administered 2022-08-01: 650 mg via ORAL
  Filled 2022-08-01: qty 2

## 2022-08-01 MED ORDER — DIPHENHYDRAMINE HCL 25 MG PO CAPS: 25.0000 mg | ORAL_CAPSULE | Freq: Four times a day (QID) | ORAL | Status: DC | PRN

## 2022-08-01 MED ORDER — ACETAMINOPHEN 325 MG PO TABS: 650.0000 mg | ORAL_TABLET | ORAL | Status: DC | PRN

## 2022-08-01 MED ORDER — PRENATAL MULTIVITAMIN CH
1.0000 | ORAL_TABLET | Freq: Every day | ORAL | Status: DC
  Administered 2022-08-01 – 2022-08-03 (×3): 1 via ORAL
  Filled 2022-08-01 (×3): qty 1

## 2022-08-01 MED ORDER — DIBUCAINE (PERIANAL) 1 % EX OINT: 1.0000 | TOPICAL_OINTMENT | CUTANEOUS | Status: DC | PRN

## 2022-08-01 MED ORDER — SOD CITRATE-CITRIC ACID 500-334 MG/5ML PO SOLN: 30.0000 mL | ORAL | Status: DC | PRN

## 2022-08-01 MED ORDER — ONDANSETRON HCL 4 MG/2ML IJ SOLN: 4.0000 mg | Freq: Four times a day (QID) | INTRAMUSCULAR | Status: DC | PRN

## 2022-08-01 MED ORDER — MISOPROSTOL 200 MCG PO TABS
200.0000 ug | ORAL_TABLET | Freq: Once | ORAL | Status: AC
  Administered 2022-08-01: 200 ug via RECTAL

## 2022-08-01 MED ORDER — LACTATED RINGERS IV SOLN: 500.0000 mL | INTRAVENOUS | Status: DC | PRN

## 2022-08-01 MED ORDER — SENNOSIDES-DOCUSATE SODIUM 8.6-50 MG PO TABS
2.0000 | ORAL_TABLET | Freq: Every day | ORAL | Status: DC
  Administered 2022-08-02 – 2022-08-03 (×2): 2 via ORAL
  Filled 2022-08-01 (×2): qty 2

## 2022-08-01 MED ORDER — IBUPROFEN 600 MG PO TABS
600.0000 mg | ORAL_TABLET | Freq: Four times a day (QID) | ORAL | Status: DC
  Administered 2022-08-01 – 2022-08-03 (×7): 600 mg via ORAL
  Filled 2022-08-01 (×7): qty 1

## 2022-08-01 MED ORDER — LACTATED RINGERS IV BOLUS
1000.0000 mL | Freq: Once | INTRAVENOUS | Status: AC
  Administered 2022-08-01: 1000 mL via INTRAVENOUS

## 2022-08-01 MED ORDER — SIMETHICONE 80 MG PO CHEW: 80.0000 mg | CHEWABLE_TABLET | ORAL | Status: DC | PRN

## 2022-08-01 MED ORDER — ONDANSETRON HCL 4 MG PO TABS: 4.0000 mg | ORAL_TABLET | ORAL | Status: DC | PRN

## 2022-08-01 MED ORDER — OXYTOCIN 10 UNIT/ML IJ SOLN
10.0000 [IU] | Freq: Once | INTRAMUSCULAR | Status: AC
  Administered 2022-08-01: 10 [IU] via INTRAMUSCULAR

## 2022-08-01 MED ORDER — COCONUT OIL OIL
1.0000 | TOPICAL_OIL | Status: DC | PRN
  Filled 2022-08-01: qty 7.5

## 2022-08-01 MED ORDER — ONDANSETRON HCL 4 MG/2ML IJ SOLN: 4.0000 mg | INTRAMUSCULAR | Status: DC | PRN

## 2022-08-01 MED ORDER — HYDROXYCHLOROQUINE SULFATE 200 MG PO TABS
200.0000 mg | ORAL_TABLET | Freq: Two times a day (BID) | ORAL | Status: DC
  Administered 2022-08-01 – 2022-08-03 (×4): 200 mg via ORAL
  Filled 2022-08-01 (×5): qty 1

## 2022-08-01 MED ORDER — ZOLPIDEM TARTRATE 5 MG PO TABS: 5.0000 mg | ORAL_TABLET | Freq: Every evening | ORAL | Status: DC | PRN

## 2022-08-01 MED ORDER — FERROUS SULFATE 325 (65 FE) MG PO TABS
325.0000 mg | ORAL_TABLET | Freq: Two times a day (BID) | ORAL | Status: DC
  Administered 2022-08-01 – 2022-08-03 (×4): 325 mg via ORAL
  Filled 2022-08-01 (×4): qty 1

## 2022-08-01 MED ORDER — IBUPROFEN 600 MG PO TABS
ORAL_TABLET | ORAL | Status: AC
  Administered 2022-08-01: 600 mg
  Filled 2022-08-01: qty 1

## 2022-08-01 MED ORDER — LIDOCAINE HCL (PF) 1 % IJ SOLN: 30.0000 mL | INTRAMUSCULAR | Status: DC | PRN

## 2022-08-01 MED ORDER — BENZOCAINE-MENTHOL 20-0.5 % EX AERO
1.0000 | INHALATION_SPRAY | CUTANEOUS | Status: DC | PRN
  Filled 2022-08-01: qty 56

## 2022-08-01 MED ORDER — OXYTOCIN-SODIUM CHLORIDE 30-0.9 UT/500ML-% IV SOLN
INTRAVENOUS | Status: AC
  Filled 2022-08-01: qty 500

## 2022-08-01 NOTE — Discharge Summary (Signed)
Obstetrical Discharge Summary  Patient Name: Angelica Valenzuela DOB: 1991-02-14 MRN: [id number]  Date of Admission: 08/01/2022 Date of Delivery: 08/01/22 Delivered by: Magda Kiel CNM  Date of Discharge: 08/03/2022  Primary OB: [facility] [id number] last menstrual period was 10/19/2021 (exact date). EDC Estimated Date of Delivery: 07/26/22 Gestational Age at Delivery: [redacted]w[redacted]d  Antepartum complications:  1. Rheumatoid arthritis and scleroderma: taking Plaquenil '200mg'$  BID 2. Pyelonephritis 2nd trimester 3. Hx Pre-E with G1 4. 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   Pyelonephritis complicating pregnancy [id number]  History of pre-eclampsia in prior pregnancy, currently pregnant 02/20/2022   High-risk pregnancy 12/01/2021   Rheumatoid arthritis involving multiple sites with positive rheumatoid factor ([place] 11/14/2021   Acute blood loss anemia 12/23/2019   Raynaud's disease without gangrene 03/26/2015   PCOS (polycystic ovarian syndrome) 03/26/2015    Discharge Diagnosis: Term Pregnancy Delivered      Augmentation: N/A Complications: None Intrapartum complications/course: arrived in active and pushed twice; see delivery summary Delivery Type: spontaneous vaginal delivery Anesthesia: non-pharmacological methods Placenta: spontaneous To Pathology: No  Laceration: none Episiotomy: none Newborn Data: Live born female  Birth Weight:  7#4 APGAR: 8, 9  Newborn Delivery   Birth date/time: 08/01/2022 05:19:24 Delivery type: Vaginal, Spontaneous      Postpartum Procedures: none Edinburgh:     08/01/2022   12:00 PM 12/24/2019    11:48 AM  Edinburgh Postnatal Depression Scale Screening Tool  I have been able to laugh and see the funny side of things. 3 0  I have looked forward with enjoyment to things. 0 0  I have blamed myself unnecessarily when things went wrong. 0 0  I have been anxious or worried for no good reason. 2 0  I have felt scared or panicky for no good reason. 1 0  Things have been getting on top of me. 0 1  I have been so unhappy that I have had difficulty sleeping. 0 0  I have felt sad or miserable. 0 0  I have been so unhappy that I have been crying. 0 0  The thought of harming myself has occurred to me. 0 0  Edinburgh Postnatal Depression Scale Total 6 1     Post partum course:  Patient had an uncomplicated postpartum course.  By time of discharge on PPD#2, her pain was controlled on oral pain medications; she had appropriate lochia and was ambulating, voiding without difficulty and tolerating regular diet.  She was deemed stable for discharge to home.     Discharge Physical Exam: 08/03/2022 at 0830 BP 96/61 (BP Location: Left Arm)   Pulse 100   Temp 98 F (36.7 C) (Oral)   Resp 20   Ht '5\' 5"'$  (1.651 m)   Wt 78.5 kg   LMP 10/19/2021 (Exact Date)   SpO2 99%   BMI 28.79 kg/m   General: NAD CV: RRR Pulm: CTABL, nl effort ABD: s/nd/nt, fundus firm and 182fbelow the umbilicus Lochia: moderate Perineum:minimal edema/intact DVT Evaluation: LE non-ttp, no evidence of DVT on exam.  Hemoglobin  Date Value Ref Range Status  08/02/2022 10.2 (L) 12.0 - 15.0 g/dL Final   HGB  Date Value Ref Range Status  08/30/2011 13.3 12.0 - 16.0 g/dL Final   HCT  Date Value Ref Range Status  08/02/2022 30.7 (L) 36.0 - 46.0 % Final  08/31/2011 31.5 (L) 35.0 - 47.0 % Final    Risk assessment for postpartum VTE and prophylactic treatment: Very high risk factors: None High risk factors: None Moderate risk factors: None  Postpartum VTE prophylaxis with LMWH not indicated  Disposition: stable,  discharge to home. Baby Feeding: breast feeding Baby Disposition: home with mom  Rh Immune globulin indicated: No Rubella vaccine given: was not indicated Varivax vaccine given: was not indicated Flu vaccine given in AP setting: Yes  Tdap vaccine given in AP setting: Yes   Contraception: IUD  Prenatal Labs:   Blood type/Rh A POS  Antibody screen neg  Rubella Immune  Varicella Immune  RPR NR  HBsAg Neg  HIV NR  GC neg  Chlamydia neg  Genetic screening Negative materniT21 and AFP  1 hour GTT    3 hour GTT    GBS  POS     Plan:  Jeneen Colao was discharged to home in good condition. Follow-up appointment with delivering provider in 6 weeks.  Discharge Medications: Allergies as of 08/03/2022   No Known Allergies      Medication List     STOP taking these medications    [facility] Aspirin Low Dose 81 MG tablet Generic drug: aspirin EC   Diclegis 10-10 MG Tbec Generic drug: Doxylamine-Pyridoxine   famotidine 40 MG tablet Commonly known as: PEPCID   sulfamethoxazole-trimethoprim 800-160 MG tablet Commonly known as: BACTRIM DS       TAKE these medications    acetaminophen 325 MG tablet Commonly known as: Tylenol Take 2 tablets (650 mg total) by mouth every 4 (four) hours as needed (for pain scale < 4).   benzocaine-Menthol 20-0.5 % Aero Commonly known as: DERMOPLAST Apply 1 Application topically as needed for irritation (perineal discomfort).   coconut oil Oil Apply 1 Application topically as needed.   diphenhydrAMINE 25 mg capsule Commonly known as: BENADRYL Take 1 capsule (25 mg total) by mouth every 6 (six) hours as needed for itching.   ferrous sulfate 325 (65 FE) MG tablet Take 1 tablet (325 mg total) by mouth 2 (two) times daily with a meal.   folic acid 1 MG tablet Commonly known as: FOLVITE Take 1 tablet by mouth daily.   GaviLAX 17 GM/SCOOP powder Generic drug: polyethylene glycol powder Take 17 g by mouth daily.   hydroxychloroquine  200 MG tablet Commonly known as: PLAQUENIL Take 1 tablet (200 mg total) by mouth 2 (two) times daily. What changed: when to take this   ibuprofen 600 MG tablet Commonly known as: ADVIL Take 1 tablet (600 mg total) by mouth every 6 (six) hours.   PR Natal 400 29-1-200 & 400 MG Misc Take 1 tablet by mouth daily.   senna-docusate 8.6-50 MG tablet Commonly known as: Senokot-S Take 2 tablets by mouth daily.   simethicone 80 MG chewable tablet Commonly known as: MYLICON Chew 1 tablet (80 mg total) by mouth as needed for flatulence.   witch hazel-glycerin pad Commonly known as: TUCKS Apply 1 Application topically as needed for hemorrhoids.         Follow-up Information     Esraa Seres, Murray Hodgkins, CNM. Schedule an appointment as soon as possible for a visit in 6 week(s).   Specialty: Obstetrics and Gynecology Why: Call and schedule a follow-up appointment for a visit in 6-weeks at [facility]  with Hassan Buckler, CNM! Contact information: [place] [address] [phone]                 Signed: Francetta Found, CNM 08/03/2022 9:49 AM

## 2022-08-01 NOTE — OB Triage Note (Signed)
Sheppard Penton, CNM at bedside to attend delivery. Pt is 10/100/+2

## 2022-08-01 NOTE — Lactation Note (Signed)
This note was copied from a baby's chart. Lactation Consultation Note  Patient Name: Angelica Valenzuela [id number] Date: 08/01/2022 Reason for consult: L&D Initial assessment;Term Age:33 hours  Maternal Data Does the patient have breastfeeding experience prior to this delivery?: Yes How long did the patient breastfeed?: 6 mths Mom has RA, Raynaud's syndrome, hx of low milk supply, used herbal galactagogues with last baby, noted wide spaced breasts, little breast development with pregnancy, on Plaquenil for RA, L2 in [facility] "Medication and Mother's milk" Feeding Mother's Current Feeding Choice: Breast Milk Baby on infant warmer in [place], rooting, brought to mom and baby latched easily to right breast in cradle hold, room is noisy due to air conditioning so difficult to hear , no swallows heard at present, but baby nursing well with several sucks in a row with little stimulation.  LATCH Score Latch: Grasps breast easily, tongue down, lips flanged, rhythmical sucking.  Audible Swallowing: None  Type of Nipple: Everted at rest and after stimulation  Comfort (Breast/Nipple): Soft / non-tender  Hold (Positioning): Assistance needed to correctly position infant at breast and maintain latch.  LATCH Score: 7   Lactation Tools Discussed/Used  Encouraged frequent feeds at breast to assist with making milk and pumping breasts after most feeds, recommend using mom's own Medela pump and style at home at first before using her hands free pump obtained from insurance to initiate and build her supply.  Will set up DEBP for mom to use after transferring to Mother/Baby unit. Interventions Interventions: Breast feeding basics reviewed;Assisted with latch;Skin to skin;Support pillows;Education  Discharge Pump: Hands Free;Personal  Consult Status Consult Status: Follow-up from L&D    Ferol Luz 08/01/2022, 11:18 AM

## 2022-08-01 NOTE — Discharge Instructions (Signed)
Discharge Instructions:   Follow-up Appointment: Call and schedule a follow-up appointment for a visit in 6-weeks at [facility] with Hassan Buckler, CNM!   If there are any new medications, they have been ordered and will be available for pickup at the listed pharmacy on your way home from the hospital.   Call office if you have any of the following: headache, visual changes, fever >101.0 F, chills, shortness of breath, breast concerns, excessive vaginal bleeding, incision drainage or problems, leg pain or redness, depression or any other concerns. If you have vaginal discharge with an odor, let your doctor know.   It is normal to bleed for up to 6 weeks. You should not soak through more than 1 pad in 1 hour. If you have a blood clot larger than your fist with continued bleeding, call your doctor.   Activity: Do not lift > 10 lbs for 6 weeks (do not lift anything heavier than your baby). No intercourse, tampons, swimming pools, hot tubs, baths (only showers) for 6 weeks.  No driving for 1-2 weeks. Continue prenatal vitamin, especially if breastfeeding. Increase calories and fluids (water) while breastfeeding.   Your milk will come in, in the next couple of days (right now it is colostrum). You may have a slight fever when your milk comes in, but it should go away on its own.  If it does not, and rises above 101 F please call the doctor. You will also feel achy and your breasts will be firm. They will also start to leak. If you are breastfeeding, continue as you have been and you can pump/express milk for comfort.   If you have too much milk, your breasts can become engorged, which could lead to mastitis. This is an infection of the milk ducts. It can be very painful and you will need to notify your doctor to obtain a prescription for antibiotics. You can also treat it with a shower or hot/cold compress.   For concerns about your baby, please call your pediatrician.  For breastfeeding  concerns, the lactation consultant can be reached at [phone].   Postpartum blues (feelings of happy one minute and sad another minute) are normal for the first few weeks but if it gets worse let your doctor know.   Congratulations! We enjoyed caring for you and your new bundle of joy!

## 2022-08-01 NOTE — Lactation Note (Addendum)
This note was copied from a baby's chart. Lactation Consultation Note  Patient Name: Angelica Valenzuela [id number] Date: 08/01/2022 Reason for consult: Follow-up assessment;Mother's request Age:33 hours  Maternal Data Has patient been taught Hand Expression?: Yes DEBP set up to pump after last breastfeeding session to provide extra stimulation for milk production because of mom's hx of low supply and hypoplastic breasts  Feeding Mother's Current Feeding Choice: Breast Milk Last feeding at breast, baby breast fed x 5 min on left and fell asleep, drops of colostrum obtained from pumping given to baby per my gloved finger in corner of baby's mouth  LATCH Score Latch: Grasps breast easily, tongue down, lips flanged, rhythmical sucking.  Audible Swallowing: A few with stimulation  Type of Nipple: Everted at rest and after stimulation  Comfort (Breast/Nipple): Filling, red/small blisters or bruises, mild/mod discomfort  Hold (Positioning): No assistance needed to correctly position infant at breast.  LATCH Score: 8   Lactation Tools Discussed/Used Tools: Pump Breast pump type: Double-Electric Breast Pump Pump Education: Setup, frequency, and cleaning;Milk Storage;Other (comment) (breast milk storage magnet given) Reason for Pumping: to provide extra stimulation Pumping frequency: at least 4-5 x per day 2 Drops of colostrum obtained  Interventions Interventions: DEBP;Education  Discharge Pump: DEBP WIC Program: Yes  Consult Status Consult Status: PRN    Ferol Luz 08/01/2022, 2:50 PM

## 2022-08-01 NOTE — H&P (Signed)
OB History & Physical   History of Present Illness:  Chief Complaint: painful UCs  HPI:  Angelica Valenzuela is a 32 y.o. G3P2002 female at 24w6ddated by LMP and c/w [place] 639w5d[place]D 07/26/22.  She presents to L&D for active labor, UCs about 1hour prior, denies LOF.  - noted to be C/C/+2 by RN exam.    Pregnancy Issues: 1. Rheumatoid arthritis and scleroderma: taking Plaquenil '200mg'$  BID 2. Pyelonephritis 2nd trimester 3. Hx Pre-E with G1 4. Iron deficiency anemia   Maternal Medical History:   Past Medical History:  Diagnosis Date   Anxiety    Depression    Hx of scleroderma    Insomnia    Polycystic disease, ovaries    Raynaud's disease     Past Surgical History:  Procedure Laterality Date   ANKLE FRACTURE SURGERY Left    ESOPHAGOGASTRODUODENOSCOPY (EGD) WITH PROPOFOL N/A 03/04/2018   Procedure: ESOPHAGOGASTRODUODENOSCOPY (EGD) WITH PROPOFOL;  Surgeon: AnJonathon BellowsMD;  [facility];  Service: Gastroenterology;  Laterality: N/A;    No Known Allergies  Prior to Admission medications   Medication Sig Start Date End Date Taking? Authorizing Provider  [facility] ASPIRIN LOW DOSE 81 MG tablet Take 81 mg by mouth daily. 01/02/22   [provider]  DICLEGIS 10-10 MG TBEC Take by mouth. 02/20/22   [provider]  famotidine (PEPCID) 40 MG tablet Take 40 mg by mouth at bedtime. Patient not taking: Reported on 03/08/2022 01/24/22   [provider]  ferrous sulfate 325 (65 FE) MG tablet Take 1 tablet (325 mg total) by mouth 2 (two) times daily with a meal. 03/12/22 03/12/23  MaMinda MeoCNM  folic acid (FOLVITE) 1 MG tablet Take 1 tablet by mouth daily. 03/02/22   [provider]  GAVILAX 17 GM/SCOOP powder Take 17 g by mouth daily. 12/01/21   [provider]  hydroxychloroquine (PLAQUENIL) 200 MG tablet Take 200 mg by mouth. Patient not taking: Reported on 03/08/2022    [provider]  Prenat-FeBis-FePro-FA-CA-Omega (PR NATAL  400) 29-1-200 & 400 MG MISC Take 1 tablet by mouth daily. 01/23/22   [provider]  sulfamethoxazole-trimethoprim (BACTRIM DS) 800-160 MG tablet Take 1 tablet by mouth 2 (two) times daily. 03/12/22   MaMinda MeoCNM     [facility]   Social History: She  reports that she has never smoked. She has never used smokeless tobacco. She reports that she does not currently use alcohol. She reports that she does not use drugs.  Family History: family history includes Diabetes in her father; Hypertension in her sister.   Review of Systems: A full review of systems was performed and negative except as noted in the HPI.     Physical Exam:  Vital Signs: BP (!) 126/96   Pulse 78   LMP 10/19/2021 (Exact Date)  - no vitals obtained  General: no acute distress.  HEENT: normocephalic, atraumatic Heart: regular rate & rhythm.  No murmurs/rubs/gallops Lungs: clear to auscultation bilaterally, normal respiratory effort Abdomen: soft, gravid, non-tender Pelvic:   External: Normal external female genitalia   Extremities: non-tender, symmetric, no edema bilaterally.  DTRs: 2+  Neurologic: Alert & oriented x 3.    No results found for this or any previous visit (from the past 24 hour(s)).  Pertinent Results:  Prenatal Labs: Blood type/Rh A POS  Antibody screen neg  Rubella Immune  Varicella Immune  RPR NR  HBsAg Neg  HIV NR  GC neg  Chlamydia neg  Genetic screening Negative materniT21 and AFP  1 hour GTT   3 hour GTT   GBS  POS   FHT: 140bpm, min-mod variablity, tracing approx 22mn prior to delivery  SVE: 10/100/+2 on admit  Assessment:  Angelica Valenzuela a 32y.o. GG61P2002female at 458w6dith active labor and precipitous delivery.   Plan:  1. Admit to Labor & Delivery; consents reviewed and obtained - see delivery summary, pt delivered prior to completion of H&P  2. Fetal Well being  - Fetal Tracing: Cat I - Group B Streptococcus ppx  indicated: POS- Peds notified - Presentation: cephalic    3. Routine OB: - Prenatal labs reviewed, as above - Rh A POS - CBC, T&S, RPR on admit  4. Post Partum Planning: - Infant feeding: breast - Contraception: Paragard IUD - Tdap 06/2022 - Flu 03/2022  ReMurray HodgkinscVey, CNM 08/01/22 5:46 AM

## 2022-08-01 NOTE — OB Triage Note (Addendum)
Pt arrived to unit with complaints of contractions that started at 10pm. Pt wheeled by ED staff with husband at side. Pt is G3P2 [redacted]w[redacted]d Upon patient undressing into gown patient reports feeling pressure. Infant Head visible from visual assessment with contractions and patient is audibly in transition phase.  I, RN called out to nurses station to call Midwife for anticipated vaginal delivery. Verbal consent was given for treatment of mom and baby. Allergies reviewed. Limited OB history was reviewed. Patient handed off to Primary nurse RJolaine Click RN labor nurse.

## 2022-08-02 LAB — CBC
HCT: 30.7 % — ABNORMAL LOW (ref 36.0–46.0)
Hemoglobin: 10.2 g/dL — ABNORMAL LOW (ref 12.0–15.0)
MCH: 29.7 pg (ref 26.0–34.0)
MCHC: 33.2 g/dL (ref 30.0–36.0)
MCV: 89.2 fL (ref 80.0–100.0)
Platelets: 227 10*3/uL (ref 150–400)
RBC: 3.44 MIL/uL — ABNORMAL LOW (ref 3.87–5.11)
RDW: 13.5 % (ref 11.5–15.5)
WBC: 9 10*3/uL (ref 4.0–10.5)
nRBC: 0 % (ref 0.0–0.2)

## 2022-08-02 NOTE — Progress Notes (Signed)
Postpartum Day  1  Subjective: no complaints, up ad lib, voiding, tolerating PO, and + flatus  Doing well, no concerns. Ambulating without difficulty, pain managed with PO meds, tolerating regular diet, and voiding without difficulty.   No fever/chills, chest pain, shortness of breath, nausea/vomiting, or leg pain. No nipple or breast pain. No headache, visual changes, or RUQ/epigastric pain.  Objective: BP 112/72 (BP Location: Left Arm)   Pulse 75   Temp 97.7 F (36.5 C) (Oral)   Resp 20   Ht '5\' 5"'$  (1.651 m)   Wt 78.5 kg   LMP 10/19/2021 (Exact Date)   SpO2 98% Comment: Room Air  BMI 28.79 kg/m    Physical Exam:  General: alert, cooperative, and appears stated age Breasts: soft/nontender CV: RRR Pulm: nl effort, CTABL Abdomen: soft, non-tender, active bowel sounds Uterine Fundus: firm Perineum: minimal edema, intact Lochia: appropriate DVT Evaluation: No evidence of DVT seen on physical exam. Negative Homan's sign. No cords or calf tenderness. No significant calf/ankle edema.  Recent Labs    08/01/22 0609 08/02/22 0515  HGB 12.1 10.2*  HCT 37.1 30.7*  WBC 10.1 9.0  PLT 265 227    Assessment/Plan: 32 y.o. G3P2002 postpartum day # 1  -Continue routine postpartum care -Lactation consult PRN for breastfeeding  -Discussed contraceptive options including implant, IUDs hormonal and non-hormonal, injection, pills/ring/patch, condoms, and NFP.  -Acute blood loss anemia - hemodynamically stable and asymptomatic; start PO ferrous sulfate BID with stool softeners  -Immunization status:   all immunizations up to date -Considering ParaGard IUD vs Vasectomy   Disposition: Continue inpatient postpartum care    LOS: 1 day   [place], CNM 08/02/2022, [id number] AM   ----- Angelica Valenzuela [facility]

## 2022-08-03 MED ORDER — DIPHENHYDRAMINE HCL 25 MG PO CAPS: 25.0000 mg | ORAL_CAPSULE | Freq: Four times a day (QID) | ORAL | 0 refills | Status: AC | PRN

## 2022-08-03 MED ORDER — IBUPROFEN 600 MG PO TABS: 600.0000 mg | ORAL_TABLET | Freq: Four times a day (QID) | ORAL | 0 refills | Status: DC

## 2022-08-03 MED ORDER — SENNOSIDES-DOCUSATE SODIUM 8.6-50 MG PO TABS: 2.0000 | ORAL_TABLET | Freq: Every day | ORAL | 0 refills | Status: AC

## 2022-08-03 MED ORDER — BENZOCAINE-MENTHOL 20-0.5 % EX AERO: 1.0000 | INHALATION_SPRAY | CUTANEOUS | Status: AC | PRN

## 2022-08-03 MED ORDER — COCONUT OIL OIL: 1.0000 | TOPICAL_OIL | 0 refills | Status: AC | PRN

## 2022-08-03 MED ORDER — WITCH HAZEL-GLYCERIN EX PADS: 1.0000 | MEDICATED_PAD | CUTANEOUS | 12 refills | Status: DC | PRN

## 2022-08-03 MED ORDER — SIMETHICONE 80 MG PO CHEW: 80.0000 mg | CHEWABLE_TABLET | ORAL | 0 refills | Status: DC | PRN

## 2022-08-03 MED ORDER — ACETAMINOPHEN 325 MG PO TABS: 650.0000 mg | ORAL_TABLET | ORAL | Status: AC | PRN

## 2022-08-03 NOTE — Progress Notes (Signed)
Pt discharged with infant.  Discharge instructions, prescriptions and follow up appointment given to and reviewed with pt. Pt verbalized understanding. Escorted out by auxillary. 

## 2022-08-10 ENCOUNTER — Emergency Department: Payer: Medicaid Other

## 2022-08-10 ENCOUNTER — Emergency Department
Admission: EM | Admit: 2022-08-10 | Discharge: 2022-08-10 | Disposition: A | Discharge status: Home / Self Care | Payer: Medicaid Other | Attending: Emergency Medicine | Admitting: Emergency Medicine

## 2022-08-10 ENCOUNTER — Encounter: Payer: Self-pay | Admitting: Radiology

## 2022-08-10 DIAGNOSIS — N939 Abnormal uterine and vaginal bleeding, unspecified: Secondary | ICD-10-CM

## 2022-08-10 DIAGNOSIS — M79659 Pain in unspecified thigh: Secondary | ICD-10-CM | POA: Insufficient documentation

## 2022-08-10 DIAGNOSIS — M7918 Myalgia, other site: Secondary | ICD-10-CM

## 2022-08-10 DIAGNOSIS — R519 Headache, unspecified: Secondary | ICD-10-CM | POA: Diagnosis not present

## 2022-08-10 DIAGNOSIS — R42 Dizziness and giddiness: Secondary | ICD-10-CM | POA: Insufficient documentation

## 2022-08-10 LAB — BASIC METABOLIC PANEL
Anion gap: 11 (ref 5–15)
BUN: 10 mg/dL (ref 6–20)
CO2: 22 mmol/L (ref 22–32)
Calcium: 8.6 mg/dL — ABNORMAL LOW (ref 8.9–10.3)
Chloride: 103 mmol/L (ref 98–111)
Creatinine, Ser: 0.5 mg/dL (ref 0.44–1.00)
GFR, Estimated: >60 mL/min (ref >60)
Glucose, Bld: 103 mg/dL — ABNORMAL HIGH (ref 70–99)
Potassium: 4 mmol/L (ref 3.5–5.1)
Sodium: 136 mmol/L (ref 135–145)

## 2022-08-10 LAB — CBC
HCT: 36.8 % (ref 36.0–46.0)
Hemoglobin: 11.5 g/dL — ABNORMAL LOW (ref 12.0–15.0)
MCH: 28.7 pg (ref 26.0–34.0)
MCHC: 31.3 g/dL (ref 30.0–36.0)
MCV: 91.8 fL (ref 80.0–100.0)
Platelets: 435 10*3/uL — ABNORMAL HIGH (ref 150–400)
RBC: 4.01 MIL/uL (ref 3.87–5.11)
RDW: 13.2 % (ref 11.5–15.5)
WBC: 8.1 10*3/uL (ref 4.0–10.5)
nRBC: 0 % (ref 0.0–0.2)

## 2022-08-10 LAB — URINALYSIS, ROUTINE W REFLEX MICROSCOPIC
Bacteria, UA: NONE SEEN
Bilirubin Urine: NEGATIVE
Glucose, UA: NEGATIVE mg/dL
Ketones, ur: NEGATIVE mg/dL
Leukocytes,Ua: NEGATIVE
Nitrite: NEGATIVE
Protein, ur: NEGATIVE mg/dL
Specific Gravity, Urine: 1.009 (ref 1.005–1.030)
Squamous Epithelial / HPF: NONE SEEN /HPF (ref 0–5)
pH: 6 (ref 5.0–8.0)

## 2022-08-10 LAB — HCG, QUANTITATIVE, PREGNANCY: hCG, Beta Chain, Quant, S: 75 m[IU]/mL — ABNORMAL HIGH (ref <5)

## 2022-08-10 LAB — CK: Total CK: 94 U/L (ref 38–234)

## 2022-08-10 MED ORDER — IOHEXOL 350 MG/ML SOLN
75.0000 mL | Freq: Once | INTRAVENOUS | Status: AC | PRN
  Administered 2022-08-10: 75 mL via INTRAVENOUS

## 2022-08-10 NOTE — ED Provider Notes (Signed)
[facility] Provider Note    Event Date/Time   First MD Initiated Contact with Patient 08/10/22 1559     (approximate)   History   Dizziness and Headache   HPI  Angelica Valenzuela is a 32 y.o. female   Past medical history of 2 weeks postpartum, anxiety, depression, scleroderma, presents to the emergency department with multiple complaints.  Since her delivery which was uncomplicated vaginal delivery, she has had ongoing mild headache and lightheadedness upon standing.  No visual changes.  No other neurologic symptoms.  Since her delivery she has had ongoing vaginal bleeding with some clots.  It is starting to lighten up over the last several days.  She has no pain.  She has had some mild dysuria.   External Medical Documents Reviewed: Discharge summary from 08/03/2022 from her delivery, antepartum complications including rheumatoid arthritis and scleroderma, pyelonephritis in the second trimester and iron deficiency anemia      Physical Exam   Triage Vital Signs: ED Triage Vitals  Enc Vitals Group     BP 08/10/22 1538 117/68     Pulse Rate 08/10/22 1538 85     Resp 08/10/22 1538 20     Temp 08/10/22 1538 98.4 F (36.9 C)     Temp Source 08/10/22 1538 Oral     SpO2 08/10/22 1538 98 %     Weight 08/10/22 1539 173 lb (78.5 kg)     Height 08/10/22 1539 '5\' 5"'$  (1.651 m)     Head Circumference --      Peak Flow --      Pain Score 08/10/22 1539 6     Pain Loc --      Pain Edu? --      Excl. in [place]? --     Most recent vital signs: Vitals:   08/10/22 1538  BP: 117/68  Pulse: 85  Resp: 20  Temp: 98.4 F (36.9 C)  SpO2: 98%    General: Awake, no distress.  CV:  Good peripheral perfusion.  Resp:  Normal effort.  Abd:  No distention.  Other:  Awake alert oriented comfortable nontoxic with normal vital signs soft nontender abdomen, external exam with some scant bleeding on the pad no  hemorrhaging.  Neuroexam is benign with no facial asymmetry  dysarthria motor or sensory deficits normal finger-nose normal gait.   ED Results / Procedures / Treatments   Labs (all labs ordered are listed, but only abnormal results are displayed) Labs Reviewed  URINALYSIS, ROUTINE W REFLEX MICROSCOPIC - Abnormal; Notable for the following components:      Result Value   Color, Urine YELLOW (*)    APPearance CLEAR (*)    Hgb urine dipstick SMALL (*)    All other components within normal limits  BASIC METABOLIC PANEL - Abnormal; Notable for the following components:   Glucose, Bld 103 (*)    Calcium 8.6 (*)    All other components within normal limits  CBC - Abnormal; Notable for the following components:   Hemoglobin 11.5 (*)    Platelets 435 (*)    All other components within normal limits  HCG, QUANTITATIVE, PREGNANCY - Abnormal; Notable for the following components:   hCG, Beta Chain, Quant, S 75 (*)    All other components within normal limits  CK     I ordered and reviewed the above labs they are notable for urinalysis without signs of urinary infection, hemoglobin is 11.5  EKG  ED ECG REPORT I, Purcell Nails  Jacelyn Grip, the attending physician, personally viewed and interpreted this ECG.   Date: 08/10/2022  EKG Time: 1544  Rate: 72  Rhythm: nsr  Axis: rad  Intervals:none  ST&T Change: no stemi    RADIOLOGY I independently reviewed and interpreted CT of the head and see no obvious bleeding or midline shift   PROCEDURES:  Critical Care performed: No  Procedures   MEDICATIONS ORDERED IN ED: Medications  iohexol (OMNIPAQUE) 350 MG/ML injection 75 mL (75 mLs Intravenous Contrast Given 08/10/22 1702)    IMPRESSION / MDM / ASSESSMENT AND PLAN / ED COURSE  I reviewed the triage vital signs and the nursing notes.                                Patient's presentation is most consistent with acute presentation with potential threat to life or bodily function.  Differential diagnosis includes, but is not limited to, cavernous  sinus thrombosis, intracranial bleeding, complicated migraine, dehydration electrolyte disturbance, urinary tract infection, abnormal uterine bleeding, menstrual period, retained products, blood loss anemia   MDM: Mild headache and orthostatic lightheadedness will check for emergent pathologies like thrombosis, head bleed with CTV which fortunately looks negative.  No other acute neurodeficits to suggest CVA or other acute neurologic pathology.  In terms of her buttock pain, there are no lesions in the area suggestive of infection or abscess given chronicity normal gait normal exam normal CK will defer for outpatient follow-up.  Transvaginal ultrasound shows some heterogenous material consistent with postpartum changes.  I considered retained products but given a very well appearing patient with the above transvaginal ultrasound normal hemoglobin normal vital signs and no pain or symptoms suggestive of infection, I think she can follow-up these results with her obstetrics provider this week.         FINAL CLINICAL IMPRESSION(S) / ED DIAGNOSES   Final diagnoses:  Nonintractable headache, unspecified chronicity pattern, unspecified headache type  Lightheadedness  Buttock pain  Vaginal bleeding     Rx / DC Orders   ED Discharge Orders     None        Note:  This document was prepared using Dragon voice recognition software and may include unintentional dictation errors.    Lucillie Garfinkel, MD 08/10/22 [phone]

## 2022-08-10 NOTE — Discharge Instructions (Signed)
Drink plenty of fluids to stay well-hydrated.  Continue taking your iron supplements and follow-up with your doctor to recheck your blood levels.  Take Tylenol 650 mg and ibuprofen 400 mg every 6 hours for headache.  Take with food.  Call your obstetrics/gynecologist for follow-up appointment this week to check on your vaginal bleeding and to review the results of your transvaginal ultrasound.  Thank you for choosing [place] for your health care today!  Please see your primary doctor this week for a follow up appointment.   Sometimes, in the early stages of certain disease courses it is difficult to detect in the emergency department evaluation -- so, it is important that you continue to monitor your symptoms and call your doctor right away or return to the emergency department if you develop any new or worsening symptoms.  Please go to the following website to schedule new (and existing) patient appointments:   [email or web address]  If you do not have a primary doctor try calling the following clinics to establish care:  If you have insurance:  [facility] [id number] [place] [address]   [facility]  [phone] [place] [place] [address]   If you do not have insurance:  [facility]  [phone] [address], [place] [address]   The following is another list of primary care offices in the area who are accepting new patients at this time.  Please reach out to one of them directly and let them know you would like to schedule an appointment to follow up on an Emergency Department visit, and/or to establish a new primary care provider (PCP).  There are likely other primary care clinics in the are who are accepting new patients, but this is an excellent place to start:  [place] physician: Dr Lavon Paganini [address] [place], [place]  [address] [phone]  [facility] Lead Physician: Dr Steele Sizer [address], [place], [place] [address] [phone]  [place] Physician: Dr Park Liter [address] [place], [place] [address] [phone]  [facility] Lead Physician: Dr Dewaine Oats [place], [place], [place] [address] [phone]  [facility] Physician: Dr Halina Maidens [address] [place], [place] [address] [phone]   It was my pleasure to care for you today.   Angelica Brunette Jacelyn Grip, MD

## 2022-08-10 NOTE — ED Triage Notes (Signed)
Pt present to the ED via POV due to dizziness, HA and vaginal bleeding. Pt states she delivered a week ago. Pt carried to 42 weeks. Pt denies V/D. Pt denies LOC. Pt A&Ox4

## 2022-08-10 NOTE — ED Notes (Signed)
Pt states vaginal bleeding since delivery 2 weeks ago, states some clots, but states the bleeding has slowly decreased, states constipation but is taking iron supplements, states she is having bilateral pain to the muscles by the crease below her buttocks area and says it feels like she is about to contract, also states she has been having headaches and a migraine two days ago that caused her to have blurred vision in the R eye, states dizziness when she stands up also

## 2022-08-17 DIAGNOSIS — N812 Incomplete uterovaginal prolapse: Secondary | ICD-10-CM | POA: Insufficient documentation

## 2022-08-17 DIAGNOSIS — N939 Abnormal uterine and vaginal bleeding, unspecified: Secondary | ICD-10-CM | POA: Diagnosis present

## 2022-08-17 LAB — CBC WITH DIFFERENTIAL/PLATELET
Abs Immature Granulocytes: 0.02 10*3/uL (ref 0.00–0.07)
Basophils Absolute: 0.1 10*3/uL (ref 0.0–0.1)
Basophils Relative: 1 %
Eosinophils Absolute: 1.9 10*3/uL — ABNORMAL HIGH (ref 0.0–0.5)
Eosinophils Relative: 20 %
HCT: 35.5 % — ABNORMAL LOW (ref 36.0–46.0)
Hemoglobin: 11.2 g/dL — ABNORMAL LOW (ref 12.0–15.0)
Immature Granulocytes: 0 %
Lymphocytes Relative: 29 %
Lymphs Abs: 2.7 10*3/uL (ref 0.7–4.0)
MCH: 28.6 pg (ref 26.0–34.0)
MCHC: 31.5 g/dL (ref 30.0–36.0)
MCV: 90.6 fL (ref 80.0–100.0)
Monocytes Absolute: 0.5 10*3/uL (ref 0.1–1.0)
Monocytes Relative: 6 %
Neutro Abs: 4 10*3/uL (ref 1.7–7.7)
Neutrophils Relative %: 44 %
Platelets: 441 10*3/uL — ABNORMAL HIGH (ref 150–400)
RBC: 3.92 MIL/uL (ref 3.87–5.11)
RDW: 12.8 % (ref 11.5–15.5)
WBC: 9.2 10*3/uL (ref 4.0–10.5)
nRBC: 0 % (ref 0.0–0.2)

## 2022-08-17 NOTE — ED Triage Notes (Signed)
To triage via wheelchair with c/o vaginal bleeding and States it felt like she needed to push and then felt a " glob of skin came out. It felt like I was having a contraction."  Pt states she still has what may be tissue outside of vagina. Apx the size of an orange.  Delivered 08/01/22 and reports no prior complications and no problems with delivery of child.

## 2022-08-18 ENCOUNTER — Emergency Department: Payer: Medicaid Other

## 2022-08-18 ENCOUNTER — Emergency Department
Admission: EM | Admit: 2022-08-18 | Discharge: 2022-08-18 | Disposition: A | Discharge status: Home / Self Care | Payer: Medicaid Other | Attending: Emergency Medicine | Admitting: Emergency Medicine

## 2022-08-18 ENCOUNTER — Other Ambulatory Visit: Payer: Self-pay

## 2022-08-18 DIAGNOSIS — N812 Incomplete uterovaginal prolapse: Secondary | ICD-10-CM

## 2022-08-18 LAB — COMPREHENSIVE METABOLIC PANEL
ALT: 57 U/L — ABNORMAL HIGH (ref 0–44)
AST: 41 U/L (ref 15–41)
Albumin: 3.5 g/dL (ref 3.5–5.0)
Alkaline Phosphatase: 187 U/L — ABNORMAL HIGH (ref 38–126)
Anion gap: 8 (ref 5–15)
BUN: 14 mg/dL (ref 6–20)
CO2: 27 mmol/L (ref 22–32)
Calcium: 8.5 mg/dL — ABNORMAL LOW (ref 8.9–10.3)
Chloride: 102 mmol/L (ref 98–111)
Creatinine, Ser: 0.6 mg/dL (ref 0.44–1.00)
GFR, Estimated: >60 mL/min (ref >60)
Glucose, Bld: 93 mg/dL (ref 70–99)
Potassium: 4 mmol/L (ref 3.5–5.1)
Sodium: 137 mmol/L (ref 135–145)
Total Bilirubin: 0.9 mg/dL (ref 0.3–1.2)
Total Protein: 7.3 g/dL (ref 6.5–8.1)

## 2022-08-18 LAB — HCG, QUANTITATIVE, PREGNANCY: hCG, Beta Chain, Quant, S: 7 m[IU]/mL — ABNORMAL HIGH (ref <5)

## 2022-08-18 MED ORDER — POLYETHYLENE GLYCOL 3350 17 G PO PACK: 17.0000 g | PACK | Freq: Every day | ORAL | 0 refills | Status: DC

## 2022-08-18 MED ORDER — POLYETHYLENE GLYCOL 3350 17 G PO PACK: 17.0000 g | PACK | Freq: Every day | ORAL | 0 refills | Status: AC

## 2022-08-18 NOTE — ED Provider Notes (Signed)
[facility] Provider Note    Event Date/Time   First MD Initiated Contact with Patient 08/18/22 [phone]     (approximate)   History   Vaginal Bleeding   HPI  Angelica Valenzuela is a 32 y.o. female past medical history of rheumatoid arthritis, scleroderma, recent vaginal delivery on 2/24 who presents due to concern for a mass coming out of the vagina.  This morning patient had sensation that she needed to push.  When she went to the bathroom she saw a golf sized what looked like tissue coming from the vagina.  She was scared and then pushed it back up.  She has had ongoing bleeding since having her vaginal delivery.  Describes it as pretty significant bleeding up until today and is just been late today.  Has ongoing lower abdominal cramping.  Has also had some night sweats but no fevers.  Denies urinary symptoms.   Patient presented to the ED on 3/4 for lightheadedness and ongoing vaginal bleeding.  Ultrasound at that time showed some increased echogenicity in the uterus which can be seen in the postpartum state without significant vascularity.    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   Pyelonephritis complicating pregnancy [id number]   History of pre-eclampsia in prior pregnancy, currently pregnant 02/20/2022   High-risk pregnancy 12/01/2021   Rheumatoid arthritis involving multiple sites with positive rheumatoid factor ([place]) 11/14/2021   Acute blood loss anemia 12/23/2019   Raynaud's disease without gangrene 03/26/2015   PCOS (polycystic ovarian syndrome) 03/26/2015     Physical Exam  Triage  Vital Signs: ED Triage Vitals  Enc Vitals Group     BP 08/17/22 2349 126/76     Pulse Rate 08/17/22 2349 81     Resp 08/17/22 2349 18     Temp 08/17/22 2349 98.4 F (36.9 C)     Temp Source 08/17/22 2349 Oral     SpO2 08/17/22 2349 99 %     Weight 08/17/22 2350 165 lb (74.8 kg)     Height 08/17/22 2350 '5\' 5"'$  (1.651 m)     Head Circumference --      Peak Flow --      Pain Score 08/17/22 2350 6     Pain Loc --      Pain Edu? --      Excl. in [place]? --     Most recent vital signs: Vitals:   08/17/22 2349  BP: 126/76  Pulse: 81  Resp: 18  Temp: 98.4 F (36.9 C)  SpO2: 99%     General: Awake, no distress.  CV:  Good peripheral perfusion.  Resp:  Normal effort.  Abd:  No distention.  Mild tenderness to palpation bilateral lower quadrants and suprapubic region, no guarding, soft abdomen Neuro:             Awake, Alert, Oriented x 3  Other:  Closed cervix on pelvic exam, cervix is somewhat low-lying, no active bleeding, no masses no cervical motion tenderness no adnexal masses palpated   ED Results / Procedures / Treatments  Labs (all  labs ordered are listed, but only abnormal results are displayed) Labs Reviewed  CBC WITH DIFFERENTIAL/PLATELET - Abnormal; Notable for the following components:      Result Value   Hemoglobin 11.2 (*)    HCT 35.5 (*)    Platelets 441 (*)    Eosinophils Absolute 1.9 (*)    All other components within normal limits  COMPREHENSIVE METABOLIC PANEL - Abnormal; Notable for the following components:   Calcium 8.5 (*)    ALT 57 (*)    Alkaline Phosphatase 187 (*)    All other components within normal limits  HCG, QUANTITATIVE, PREGNANCY - Abnormal; Notable for the following components:   hCG, Beta Chain, Quant, S 7 (*)    All other components within normal limits  URINALYSIS, ROUTINE W REFLEX MICROSCOPIC     EKG     RADIOLOGY    PROCEDURES:  Critical Care performed: No  Procedures  The patient is on the cardiac monitor to  evaluate for evidence of arrhythmia and/or significant heart rate changes.   MEDICATIONS ORDERED IN ED: Medications - No data to display   IMPRESSION / MDM / [place] / ED COURSE  I reviewed the triage vital signs and the nursing notes.                              Patient's presentation is most consistent with acute complicated illness / injury requiring diagnostic workup.  Differential diagnosis includes, but is not limited to, vaginal or cervical prolapse, prolapsing fibroid, retained products, postpartum endometritis  Patient is a 32 year old female status post vaginal delivery on 2/24 presents because she saw a mass protruding out of her vagina today after she had sensation that she needed to push.  Has had ongoing vaginal bleeding since the delivery and patient was seen on 3/4 8 days ago with ultrasound that showed likely blood but no hypervascularity in the uterus.  Bleeding is actually slowed down but today she had sensation she needed to push and saw something protruding out of the vagina that was about the size of a golf ball looked pink in color and she was able to push it back in.  Has some ongoing lower abdominal cramping no fevers but has had some night sweats.  Patient looks well has some mild lower quadrant bilateral tenderness on exam.  On pelvic exam cervix is somewhat low-lying there is no active bleeding no masses.  Question whether this was a cervical prolapse.  Given she is having some ongoing vaginal bleeding we will repeat the ultrasound and quantitative hCG.  Hemoglobin is okay.   Patient's hCG is 7 which is down trended from 75 8 days ago.  With normalizing hCG no active bleeding on exam and no abnormal masses seen on exam I feel that utility of ultrasound looking for retained products is low.  Discussed with the patient she is agreeable to deferring ultrasound at this point.  I overall suspect that this was likely a cervical prolapse especially since she was  able to self reduce it and I do not see any masses on the cervical exam.  Patient is having significant constipation and increased intra-abdominal pressure from straining could certainly contribute to this so we will prescribe MiraLAX.  Recommend she follow-up with her OB/GYN.     FINAL CLINICAL IMPRESSION(S) / ED DIAGNOSES   Final diagnoses:  Cervical prolapse     Rx / DC Orders   ED  Discharge Orders          Ordered    polyethylene glycol (MIRALAX) 17 g packet  Daily        08/18/22 0447             Note:  This document was prepared using Dragon voice recognition software and may include unintentional dictation errors.   Rada Hay, MD 08/18/22 [phone]

## 2022-08-18 NOTE — ED Notes (Signed)
Pt A&O x4, no obvious distress noted, respirations regular/unlabored. Pt verbalizes understanding of discharge instructions. Pt able to ambulate from ED independently.   

## 2022-08-18 NOTE — Discharge Instructions (Addendum)
I suspect that you had a prolapse of your cervix.  This can happen after giving birth.  If you have recurrence you can reduce it with your fingers.  If you have persistent prolapse or ongoing discomfort please follow-up with your OB/GYN.  Please start taking the MiraLAX once to twice daily for constipation.

## 2022-10-05 ENCOUNTER — Ambulatory Visit
Admission: RE | Admit: 2022-10-05 | Discharge: 2022-10-05 | Disposition: A | Discharge status: Home / Self Care | Payer: BC Managed Care – PPO | Source: Ambulatory Visit | Attending: Chiropractor | Admitting: Chiropractor

## 2022-10-05 ENCOUNTER — Other Ambulatory Visit: Payer: Self-pay | Admitting: Chiropractor

## 2022-10-05 ENCOUNTER — Ambulatory Visit
Admission: RE | Admit: 2022-10-05 | Discharge: 2022-10-05 | Disposition: A | Discharge status: Home / Self Care | Payer: BC Managed Care – PPO | Attending: Chiropractor | Admitting: Chiropractor

## 2022-10-05 DIAGNOSIS — S161XXA Strain of muscle, fascia and tendon at neck level, initial encounter: Secondary | ICD-10-CM

## 2022-10-05 DIAGNOSIS — S29019A Strain of muscle and tendon of unspecified wall of thorax, initial encounter: Secondary | ICD-10-CM | POA: Insufficient documentation

## 2022-10-26 ENCOUNTER — Inpatient Hospital Stay: Payer: BC Managed Care – PPO

## 2022-10-26 ENCOUNTER — Encounter: Payer: Self-pay | Admitting: Oncology

## 2022-10-26 ENCOUNTER — Inpatient Hospital Stay: Payer: BC Managed Care – PPO | Attending: Oncology | Admitting: Oncology

## 2022-10-26 VITALS — BP 107/79 | HR 81 | Temp 98.9°F | Resp 18 | Wt 160.0 lb

## 2022-10-26 DIAGNOSIS — D649 Anemia, unspecified: Secondary | ICD-10-CM

## 2022-10-26 DIAGNOSIS — D508 Other iron deficiency anemias: Secondary | ICD-10-CM

## 2022-10-26 DIAGNOSIS — Z79899 Other long term (current) drug therapy: Secondary | ICD-10-CM | POA: Diagnosis not present

## 2022-10-26 DIAGNOSIS — D509 Iron deficiency anemia, unspecified: Secondary | ICD-10-CM | POA: Diagnosis present

## 2022-10-26 LAB — TECHNOLOGIST SMEAR REVIEW: Plt Morphology: ADEQUATE

## 2022-10-26 LAB — CBC WITH DIFFERENTIAL/PLATELET
Abs Immature Granulocytes: 0.01 10*3/uL (ref 0.00–0.07)
Basophils Absolute: 0 10*3/uL (ref 0.0–0.1)
Basophils Relative: 1 %
Eosinophils Absolute: 1 10*3/uL — ABNORMAL HIGH (ref 0.0–0.5)
Eosinophils Relative: 15 %
HCT: 38.6 % (ref 36.0–46.0)
Hemoglobin: 11.9 g/dL — ABNORMAL LOW (ref 12.0–15.0)
Immature Granulocytes: 0 %
Lymphocytes Relative: 23 %
Lymphs Abs: 1.5 10*3/uL (ref 0.7–4.0)
MCH: 26.1 pg (ref 26.0–34.0)
MCHC: 30.8 g/dL (ref 30.0–36.0)
MCV: 84.6 fL (ref 80.0–100.0)
Monocytes Absolute: 0.5 10*3/uL (ref 0.1–1.0)
Monocytes Relative: 8 %
Neutro Abs: 3.4 10*3/uL (ref 1.7–7.7)
Neutrophils Relative %: 53 %
Platelets: 325 10*3/uL (ref 150–400)
RBC: 4.56 MIL/uL (ref 3.87–5.11)
RDW: 13.2 % (ref 11.5–15.5)
WBC: 6.5 10*3/uL (ref 4.0–10.5)
nRBC: 0 % (ref 0.0–0.2)

## 2022-10-26 LAB — RETIC PANEL
Immature Retic Fract: 9.1 % (ref 2.3–15.9)
RBC.: 4.45 MIL/uL (ref 3.87–5.11)
Retic Count, Absolute: 36.5 10*3/uL (ref 19.0–186.0)
Retic Ct Pct: 0.8 % (ref 0.4–3.1)
Reticulocyte Hemoglobin: 25.9 pg — ABNORMAL LOW (ref >27.9)

## 2022-10-26 LAB — LACTATE DEHYDROGENASE: LDH: 271 U/L — ABNORMAL HIGH (ref 98–192)

## 2022-10-26 LAB — VITAMIN B12: Vitamin B-12: 520 pg/mL (ref 180–914)

## 2022-10-26 LAB — FOLATE: Folate: 9.1 ng/mL (ref >5.9)

## 2022-10-26 LAB — IRON AND TIBC
Iron: 31 ug/dL (ref 28–170)
Saturation Ratios: 7 % — ABNORMAL LOW (ref 10.4–31.8)
TIBC: 462 ug/dL — ABNORMAL HIGH (ref 250–450)
UIBC: 431 ug/dL

## 2022-10-26 LAB — FERRITIN: Ferritin: 14 ng/mL (ref 11–307)

## 2022-10-26 LAB — TSH: TSH: 1.414 u[IU]/mL (ref 0.350–4.500)

## 2022-10-26 NOTE — Addendum Note (Signed)
Addended by: Rickard Patience on: 10/26/2022 06:27 PM   Modules accepted: Orders

## 2022-10-26 NOTE — Assessment & Plan Note (Signed)
For workup of anemia, check CBC, iron, TIBC ferritin, reticulocyte panel, B12, folate, multiple myeloma panel, light chain ratio, LDH, smear, TSH.  if she has iron deficiency anemia, we discussed about option of continue oral iron supplementation and repeat blood work for evaluation of treatment response.  If no significant improvement, then proceed with IV Venofer treatments. Alternative option of proceed with IV Venofer treatments. I discussed about the potential risks including but not limited to allergic reactions/infusion reactions including anaphylactic reactions, phlebitis, high blood pressure, wheezing, SOB, skin rash, weight gain,dark urine, leg swelling, back pain, headache, nausea and fatigue, etc. Patient tolerates oral iron supplement poorly and desires to achieved higher level of iron faster for adequate hematopoesis.   Available blood work results were reviewed at the time of dictation. Lab Results  Component Value Date   HGB 11.9 (L) 10/26/2022   TIBC 462 (H) 10/26/2022   IRONPCTSAT 7 (L) 10/26/2022   FERRITIN 14 10/26/2022    Consistent with iron deficiency anemia Will arrange patient to get IV Venofer weekly x 4.
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
## 2022-10-27 ENCOUNTER — Telehealth: Payer: Self-pay

## 2022-10-27 LAB — KAPPA/LAMBDA LIGHT CHAINS
Kappa free light chain: 16.9 mg/L (ref 3.3–19.4)
Kappa, lambda light chain ratio: 0.93 (ref 0.26–1.65)
Lambda free light chains: 18.2 mg/L (ref 5.7–26.3)

## 2022-10-27 NOTE — Telephone Encounter (Signed)
Called pt, no answer. Detailed message left with follow up plan. Left call back # for her to call and schedule appts:   IV venofer weekly x4 Labs in 3 months  MD/ venofer 1-2 days after labs.

## 2022-10-27 NOTE — Telephone Encounter (Signed)
-----   Message from Rickard Patience, MD sent at 10/26/2022  6:29 PM EDT ----- Please let patient know that lab results are consistent with iron deficiency.  Please arrange her to get IV Venofer weekly x4.  Follow up in 3 months, lab prior to MD + Venofer thanks.

## 2022-10-28 ENCOUNTER — Encounter: Payer: Self-pay | Admitting: Oncology

## 2022-11-03 LAB — MULTIPLE MYELOMA PANEL, SERUM
Albumin SerPl Elph-Mcnc: 3.8 g/dL (ref 2.9–4.4)
Albumin/Glob SerPl: 1.3 (ref 0.7–1.7)
Alpha 1: 0.3 g/dL (ref 0.0–0.4)
Alpha2 Glob SerPl Elph-Mcnc: 0.7 g/dL (ref 0.4–1.0)
B-Globulin SerPl Elph-Mcnc: 0.9 g/dL (ref 0.7–1.3)
Gamma Glob SerPl Elph-Mcnc: 1.1 g/dL (ref 0.4–1.8)
Globulin, Total: 3 g/dL (ref 2.2–3.9)
IgA: 168 mg/dL (ref 87–352)
IgG (Immunoglobin G), Serum: 1028 mg/dL (ref 586–1602)
IgM (Immunoglobulin M), Srm: 367 mg/dL — ABNORMAL HIGH (ref 26–217)
Total Protein ELP: 6.8 g/dL (ref 6.0–8.5)

## 2022-11-05 ENCOUNTER — Encounter: Payer: Self-pay | Admitting: Oncology

## 2022-11-09 ENCOUNTER — Encounter: Payer: Self-pay | Admitting: Oncology

## 2022-11-16 ENCOUNTER — Inpatient Hospital Stay: Payer: BC Managed Care – PPO | Attending: Oncology

## 2022-11-16 VITALS — BP 92/56 | HR 68 | Temp 98.6°F | Resp 16

## 2022-11-16 DIAGNOSIS — D509 Iron deficiency anemia, unspecified: Secondary | ICD-10-CM | POA: Diagnosis present

## 2022-11-16 DIAGNOSIS — D508 Other iron deficiency anemias: Secondary | ICD-10-CM

## 2022-11-16 MED ORDER — SODIUM CHLORIDE 0.9 % IV SOLN
Freq: Once | INTRAVENOUS | Status: AC
  Filled 2022-11-16: qty 250

## 2022-11-16 MED ORDER — SODIUM CHLORIDE 0.9 % IV SOLN
200.0000 mg | Freq: Once | INTRAVENOUS | Status: AC
  Administered 2022-11-16: 200 mg via INTRAVENOUS
  Filled 2022-11-16: qty 200

## 2022-11-16 NOTE — Patient Instructions (Signed)

## 2022-11-23 ENCOUNTER — Inpatient Hospital Stay: Payer: BC Managed Care – PPO

## 2022-11-23 VITALS — BP 90/72 | HR 74 | Temp 97.9°F | Resp 16

## 2022-11-23 DIAGNOSIS — D509 Iron deficiency anemia, unspecified: Secondary | ICD-10-CM | POA: Diagnosis not present

## 2022-11-23 DIAGNOSIS — D508 Other iron deficiency anemias: Secondary | ICD-10-CM

## 2022-11-23 MED ORDER — SODIUM CHLORIDE 0.9 % IV SOLN
Freq: Once | INTRAVENOUS | Status: AC
  Filled 2022-11-23: qty 250

## 2022-11-23 MED ORDER — SODIUM CHLORIDE 0.9 % IV SOLN
200.0000 mg | Freq: Once | INTRAVENOUS | Status: AC
  Administered 2022-11-23: 200 mg via INTRAVENOUS
  Filled 2022-11-23: qty 200

## 2022-11-24 ENCOUNTER — Encounter: Payer: Self-pay | Admitting: Internal Medicine

## 2022-11-25 ENCOUNTER — Other Ambulatory Visit: Payer: Self-pay

## 2022-11-25 ENCOUNTER — Encounter: Payer: Self-pay | Admitting: Internal Medicine

## 2022-11-25 ENCOUNTER — Ambulatory Visit: Payer: BC Managed Care – PPO | Admitting: Anesthesiology

## 2022-11-25 ENCOUNTER — Ambulatory Visit
Admission: RE | Admit: 2022-11-25 | Discharge: 2022-11-25 | Disposition: A | Discharge status: Home / Self Care | Payer: BC Managed Care – PPO | Attending: Internal Medicine | Admitting: Internal Medicine

## 2022-11-25 ENCOUNTER — Encounter
Admission: RE | Disposition: A | Discharge status: Home / Self Care | Payer: Self-pay | Source: Home / Self Care | Attending: Internal Medicine

## 2022-11-25 DIAGNOSIS — K222 Esophageal obstruction: Secondary | ICD-10-CM | POA: Insufficient documentation

## 2022-11-25 DIAGNOSIS — M349 Systemic sclerosis, unspecified: Secondary | ICD-10-CM | POA: Diagnosis not present

## 2022-11-25 DIAGNOSIS — K2100 Gastro-esophageal reflux disease with esophagitis, without bleeding: Secondary | ICD-10-CM | POA: Insufficient documentation

## 2022-11-25 DIAGNOSIS — B9681 Helicobacter pylori [H. pylori] as the cause of diseases classified elsewhere: Secondary | ICD-10-CM | POA: Diagnosis not present

## 2022-11-25 DIAGNOSIS — R1013 Epigastric pain: Secondary | ICD-10-CM | POA: Insufficient documentation

## 2022-11-25 DIAGNOSIS — M069 Rheumatoid arthritis, unspecified: Secondary | ICD-10-CM | POA: Diagnosis not present

## 2022-11-25 DIAGNOSIS — R110 Nausea: Secondary | ICD-10-CM | POA: Insufficient documentation

## 2022-11-25 DIAGNOSIS — Z79899 Other long term (current) drug therapy: Secondary | ICD-10-CM | POA: Insufficient documentation

## 2022-11-25 DIAGNOSIS — K2970 Gastritis, unspecified, without bleeding: Secondary | ICD-10-CM | POA: Insufficient documentation

## 2022-11-25 DIAGNOSIS — R1314 Dysphagia, pharyngoesophageal phase: Secondary | ICD-10-CM | POA: Insufficient documentation

## 2022-11-25 DIAGNOSIS — K2210 Ulcer of esophagus without bleeding: Secondary | ICD-10-CM | POA: Diagnosis not present

## 2022-11-25 HISTORY — PX: BIOPSY: SHX5522

## 2022-11-25 HISTORY — PX: ESOPHAGOGASTRODUODENOSCOPY (EGD) WITH PROPOFOL: SHX5813

## 2022-11-25 HISTORY — PX: ESOPHAGEAL DILATION: SHX303

## 2022-11-25 LAB — POCT PREGNANCY, URINE: Preg Test, Ur: NEGATIVE

## 2022-11-25 SURGERY — ESOPHAGOGASTRODUODENOSCOPY (EGD) WITH PROPOFOL
Anesthesia: General

## 2022-11-25 MED ORDER — DEXMEDETOMIDINE HCL IN NACL 200 MCG/50ML IV SOLN
INTRAVENOUS | Status: DC | PRN
  Administered 2022-11-25: 8 ug via INTRAVENOUS
  Administered 2022-11-25: 4 ug via INTRAVENOUS

## 2022-11-25 MED ORDER — GLYCOPYRROLATE 0.2 MG/ML IJ SOLN
INTRAMUSCULAR | Status: DC | PRN
  Administered 2022-11-25: .2 mg via INTRAVENOUS

## 2022-11-25 MED ORDER — PROPOFOL 500 MG/50ML IV EMUL
INTRAVENOUS | Status: DC | PRN
  Administered 2022-11-25: 150 ug/kg/min via INTRAVENOUS

## 2022-11-25 MED ORDER — SODIUM CHLORIDE 0.9 % IV SOLN: INTRAVENOUS | Status: DC

## 2022-11-25 MED ORDER — LIDOCAINE HCL (CARDIAC) PF 100 MG/5ML IV SOSY
PREFILLED_SYRINGE | INTRAVENOUS | Status: DC | PRN
  Administered 2022-11-25: 40 mg via INTRAVENOUS

## 2022-11-25 MED ORDER — PROPOFOL 10 MG/ML IV BOLUS
INTRAVENOUS | Status: DC | PRN
  Administered 2022-11-25: 100 mg via INTRAVENOUS
  Administered 2022-11-25 (×2): 10 mg via INTRAVENOUS
  Administered 2022-11-25: 20 mg via INTRAVENOUS

## 2022-11-25 NOTE — Anesthesia Postprocedure Evaluation (Signed)
Anesthesia Post Note  Patient: Nichole Gioia  Procedure(s) Performed: ESOPHAGOGASTRODUODENOSCOPY (EGD) WITH PROPOFOL  Patient location during evaluation: Endoscopy Anesthesia Type: General Level of consciousness: awake and alert Pain management: pain level controlled Vital Signs Assessment: post-procedure vital signs reviewed and stable Respiratory status: spontaneous breathing, nonlabored ventilation, respiratory function stable and patient connected to nasal cannula oxygen Cardiovascular status: blood pressure returned to baseline and stable Postop Assessment: no apparent nausea or vomiting Anesthetic complications: no   No notable events documented.   Last Vitals:  Vitals:   11/25/22 1534 11/25/22 1544  BP: (!) 90/52 (!) 87/66  Pulse: 68 68  Resp: (!) 22 17  Temp:    SpO2: 100% 100%    Last Pain:  Vitals:   11/25/22 1544  TempSrc:   PainSc: 0-No pain                 Cleda Mccreedy Viet Kemmerer

## 2022-11-25 NOTE — H&P (Signed)
Outpatient short stay form Pre-procedure 11/25/2022 12:27 PM Branson Kranz K. Norma Fredrickson, M.D.  Primary Physician: Rickard Patience, M.D.  Reason for visit:  Esophageal dysphagia  Scleroderma (CMS/HHS-HCC)  Gastroesophageal reflux disease with esophagitis without hemorrhage  Odynophagia, unspecified   History of present illness:  32 y/o Hispanic female with a PMH of Scleroderma dx ~2016, Raynaud's disease, anxiety, and PCOS presents to the [place] [facility] for follow-up of esophageal dysphagia, odynophagia, and refractory GERD She has found the daytime and evenings to be really bad in terms of sour brash, retrosternal burning, and acid reflux. She also reports esophageal dysphagia to both solids and liquids. She feels like area of concern is in between suprasternal notch and thyroid cartilage. She struggles with dysphagia to solid foods, liquids, and even some pills. Sometimes she will involuntarily regurgitate food and liquid as well. Almost as soon as she is swallowing something some of the time it will immediately come back up her throat. She reports issues are about 50-50 between solids and liquids. She can struggle with dried foods such as meats or breads and notices a lot of difficulty if she drinks something really cold or really hot where it burn all the way down and be very painful. She feels like she has a lot of phlegm in her throat. She has been taking omeprazole 40 mg 1-2 times daily but doesn't feel like it is helping very much. She continues to follow in Rheumatology with Dr. Allena Katz for scleroderma and rheumatoid arthritis.     No current facility-administered medications for this encounter.  Current Outpatient Medications:    acetaminophen (TYLENOL) 325 MG tablet, Take 2 tablets (650 mg total) by mouth every 4 (four) hours as needed (for pain scale < 4)., Disp: , Rfl:    aspirin EC 81 MG tablet, Take 81 mg by mouth daily. Swallow whole., Disp: , Rfl:    benzocaine-Menthol (DERMOPLAST) 20-0.5 %  AERO, Apply 1 Application topically as needed for irritation (perineal discomfort)., Disp: , Rfl:    coconut oil OIL, Apply 1 Application topically as needed., Disp: , Rfl: 0   diphenhydrAMINE (BENADRYL) 25 mg capsule, Take 1 capsule (25 mg total) by mouth every 6 (six) hours as needed for itching., Disp: 30 capsule, Rfl: 0   ferrous sulfate 325 (65 FE) MG tablet, Take 1 tablet (325 mg total) by mouth 2 (two) times daily with a meal., Disp: 60 tablet, Rfl: 6   folic acid (FOLVITE) 1 MG tablet, Take 1 tablet by mouth daily., Disp: , Rfl:    hydroxychloroquine (PLAQUENIL) 200 MG tablet, Take 1 tablet (200 mg total) by mouth 2 (two) times daily., Disp: , Rfl: 0   omeprazole (PRILOSEC) 40 MG capsule, Take 40 mg by mouth 2 (two) times daily., Disp: , Rfl:    polyethylene glycol (MIRALAX) 17 g packet, Take 17 g by mouth daily., Disp: 14 each, Rfl: 0   Prenatal Vit-Fe Fumarate-FA (M-NATAL PLUS) 27-1 MG TABS, Take 1 tablet by mouth daily., Disp: , Rfl:    senna-docusate (SENOKOT-S) 8.6-50 MG tablet, Take 2 tablets by mouth daily., Disp: 60 tablet, Rfl: 0  No medications prior to admission.     No Known Allergies   Past Medical History:  Diagnosis Date   Anxiety    Depression    Hx of scleroderma    Insomnia    Polycystic disease, ovaries    Raynaud's disease     Review of systems:  Otherwise negative.    Physical Exam  Gen: Alert,  oriented. Appears stated age.  HEENT: [place]/AT. PERRLA. Lungs: CTA, no wheezes. CV: RR nl S1, S2. Abd: soft, benign, no masses. BS+ Ext: No edema. Pulses 2+    Planned procedures: Proceed with Esophagogastroduodenoscopy. The patient understands the nature of the planned procedure, indications, risks, alternatives and potential complications including but not limited to bleeding, infection, perforation, damage to internal organs and possible oversedation/side effects from anesthesia. The patient agrees and gives consent to proceed.  Please refer to procedure  notes for findings, recommendations and patient disposition/instructions.     Ajai Harville K. Norma Fredrickson, M.D. Gastroenterology 11/25/2022  12:27 PM

## 2022-11-25 NOTE — Anesthesia Preprocedure Evaluation (Signed)
Anesthesia Evaluation  Patient identified by MRN, date of birth, ID band Patient awake    Reviewed: Allergy & Precautions, NPO status , Patient's Chart, lab work & pertinent test results  History of Anesthesia Complications Negative for: history of anesthetic complications  Airway Mallampati: II  TM Distance: >3 FB Neck ROM: full    Dental no notable dental hx.    Pulmonary neg pulmonary ROS   Pulmonary exam normal        Cardiovascular negative cardio ROS Normal cardiovascular exam     Neuro/Psych  PSYCHIATRIC DISORDERS Anxiety Depression    negative neurological ROS     GI/Hepatic negative GI ROS, Neg liver ROS,,,  Endo/Other  negative endocrine ROS    Renal/GU negative Renal ROS  negative genitourinary   Musculoskeletal   Abdominal   Peds  Hematology negative hematology ROS (+)   Anesthesia Other Findings Past Medical History: No date: Anxiety No date: Depression No date: Hx of scleroderma No date: Insomnia No date: Polycystic disease, ovaries No date: Raynaud's disease  Past Surgical History: No date: ANKLE FRACTURE SURGERY; Left 03/04/2018: ESOPHAGOGASTRODUODENOSCOPY (EGD) WITH PROPOFOL; N/A     Comment:  Procedure: ESOPHAGOGASTRODUODENOSCOPY (EGD) WITH               PROPOFOL;  Surgeon: Wyline Mood, MD;  Location[facility]               ENDOSCOPY;  Service: Gastroenterology;  Laterality: N/A;     Reproductive/Obstetrics negative OB ROS                             Anesthesia Physical Anesthesia Plan  ASA: 2  Anesthesia Plan: General   Post-op Pain Management: Minimal or no pain anticipated   Induction: Intravenous  PONV Risk Score and Plan: 2 and Propofol infusion and TIVA  Airway Management Planned: Natural Airway and Nasal Cannula  Additional Equipment:   Intra-op Plan:   Post-operative Plan:   Informed Consent: I have reviewed the patients History and  Physical, chart, labs and discussed the procedure including the risks, benefits and alternatives for the proposed anesthesia with the patient or authorized representative who has indicated his/her understanding and acceptance.     Dental Advisory Given  Plan Discussed with: Anesthesiologist, CRNA and Surgeon  Anesthesia Plan Comments: (Patient consented for risks of anesthesia including but not limited to:  - adverse reactions to medications - risk of airway placement if required - damage to eyes, teeth, lips or other oral mucosa - nerve damage due to positioning  - sore throat or hoarseness - Damage to heart, brain, nerves, lungs, other parts of body or loss of life  Patient voiced understanding.)       Anesthesia Quick Evaluation

## 2022-11-25 NOTE — Transfer of Care (Signed)
Immediate Anesthesia Transfer of Care Note  Patient: Monty Seman  Procedure(s) Performed: Procedure(s): ESOPHAGOGASTRODUODENOSCOPY (EGD) WITH PROPOFOL (N/A)  Patient Location: PACU and Endoscopy Unit  Anesthesia Type:General  Level of Consciousness: sedated  Airway & Oxygen Therapy: Patient Spontanous Breathing and Patient connected to nasal cannula oxygen  Post-op Assessment: Report given to RN and Post -op Vital signs reviewed and stable  Post vital signs: Reviewed and stable  Last Vitals:  Vitals:   11/25/22 1413 11/25/22 1524  BP: 107/68 90/62  Pulse: 68 75  Resp: 16 20  Temp: 36.4 C (!) 35.9 C  SpO2: 100% 99%    Complications: No apparent anesthesia complications

## 2022-11-25 NOTE — Op Note (Signed)
[facility] Patient Name: Angelica Valenzuela Procedure Date: 11/25/2022 3:02 PM MRN: [id number] Account #: [id number] Date of Birth: 1991/01/29 Admit Type: Outpatient Age: 32 Room: [facility] ENDO ROOM 2 Gender: Female Note Status: Finalized Instrument Name: Upper Endoscope [id number] Procedure:             Upper GI endoscopy Indications:           Epigastric abdominal pain, Dysphagia, Suspected                         esophageal reflux, Failure to respond to medical                         treatment, Nausea Providers:             Boykin Nearing. Norma Fredrickson MD, MD Referring MD:          [facility] Medicines:             Propofol per Anesthesia Complications:         No immediate complications. Estimated blood loss:                         Minimal. Procedure:             Pre-Anesthesia Assessment:                        - The risks and benefits of the procedure and the                         sedation options and risks were discussed with the                         patient. All questions were answered and informed                         consent was obtained.                        - Patient identification and proposed procedure were                         verified prior to the procedure by the nurse. The                         procedure was verified in the procedure room.                        - ASA Grade Assessment: III - A patient with severe                         systemic disease.                        - After reviewing the risks and benefits, the patient                         was deemed in satisfactory condition to undergo the                         procedure.  After obtaining informed consent, the endoscope was                         passed under direct vision. Throughout the procedure,                         the patient's blood pressure, pulse, and oxygen                         saturations were monitored continuously.  The Endoscope                         was introduced through the mouth, and advanced to the                         second part of duodenum. The upper GI endoscopy was                         accomplished without difficulty. The patient tolerated                         the procedure well. Findings:      Moderately severe esophagitis with no bleeding was found in the entire       esophagus. Biopsies were obtained from the proximal and distal esophagus       with cold forceps for histology of suspected eosinophilic esophagitis.      One benign-appearing, intrinsic mild stenosis was found in the distal       esophagus. This stenosis measured 1.4 cm (inner diameter) x less than       one cm (in length). The stenosis was traversed. A TTS dilator was passed       through the scope. Dilation with a 15-16.5-18 mm balloon dilator was       performed to 16.5 mm. The dilation site was examined following endoscope       reinsertion and showed complete resolution of luminal narrowing.       Estimated blood loss was minimal.      Diffuse moderate mucosal variance characterized by erythema and       nodularity was found in the gastric body and in the gastric antrum.       Biopsies were taken with a cold forceps for histology.      Biopsies were taken with a cold forceps in the gastric body for       Helicobacter pylori testing.      The examined duodenum was normal.      The exam was otherwise without abnormality. Impression:            - Moderately severe erosive esophagitis with no                         bleeding.                        - Benign-appearing esophageal stenosis. Dilated.                        - Gastric mucosal variant. Biopsied.                        - Normal examined  duodenum.                        - The examination was otherwise normal.                        - Biopsies were taken with a cold forceps for                         evaluation of eosinophilic esophagitis.                         - Biopsies were taken with a cold forceps for                         Helicobacter pylori testing. Recommendation:        - Patient has a contact number available for                         emergencies. The signs and symptoms of potential                         delayed complications were discussed with the patient.                         Return to normal activities tomorrow. Written                         discharge instructions were provided to the patient.                        - Resume previous diet.                        - Continue present medications.                        - Monitor results to esophageal dilation                        - Await pathology results.                        - Return to physician assistant in 3 weeks.                        - Telephone GI office to schedule appointment.                        - The findings and recommendations were discussed with                         the patient. Procedure Code(s):     --- Professional ---                        [phone], Esophagogastroduodenoscopy, flexible,                         transoral; with transendoscopic balloon dilation of  esophagus (less than 30 mm diameter)                        [id number], 59, Esophagogastroduodenoscopy, flexible,                         transoral; with biopsy, single or multiple Diagnosis Code(s):     --- Professional ---                        R11.0, Nausea                        R13.10, Dysphagia, unspecified                        R10.13, Epigastric pain                        K31.89, Other diseases of stomach and duodenum                        K22.2, Esophageal obstruction                        K20.80, Other esophagitis without bleeding CPT copyright 2022 [employer]. All rights reserved. The codes documented in this report are preliminary and upon coder review may  be revised to meet current compliance requirements. Stanton Kidney MD, MD 11/25/2022 3:27:59 PM This report has been signed electronically. Number of Addenda: 0 Note Initiated On: 11/25/2022 3:02 PM Estimated Blood Loss:  Estimated blood loss was minimal.      [facility]

## 2022-11-26 ENCOUNTER — Encounter: Payer: Self-pay | Admitting: Internal Medicine

## 2022-11-30 ENCOUNTER — Inpatient Hospital Stay: Payer: BC Managed Care – PPO

## 2022-11-30 VITALS — BP 106/49 | HR 70 | Temp 98.6°F | Resp 16

## 2022-11-30 DIAGNOSIS — D509 Iron deficiency anemia, unspecified: Secondary | ICD-10-CM | POA: Diagnosis not present

## 2022-11-30 DIAGNOSIS — D508 Other iron deficiency anemias: Secondary | ICD-10-CM

## 2022-11-30 MED ORDER — SODIUM CHLORIDE 0.9 % IV SOLN
200.0000 mg | Freq: Once | INTRAVENOUS | Status: AC
  Administered 2022-11-30: 200 mg via INTRAVENOUS
  Filled 2022-11-30: qty 200

## 2022-11-30 MED ORDER — SODIUM CHLORIDE 0.9 % IV SOLN
Freq: Once | INTRAVENOUS | Status: AC
  Filled 2022-11-30: qty 250

## 2022-11-30 NOTE — Patient Instructions (Signed)

## 2022-12-07 ENCOUNTER — Inpatient Hospital Stay: Payer: BC Managed Care – PPO | Attending: Oncology

## 2022-12-07 VITALS — BP 96/62 | HR 63 | Resp 16

## 2022-12-07 DIAGNOSIS — D508 Other iron deficiency anemias: Secondary | ICD-10-CM

## 2022-12-07 DIAGNOSIS — D509 Iron deficiency anemia, unspecified: Secondary | ICD-10-CM | POA: Insufficient documentation

## 2022-12-07 MED ORDER — SODIUM CHLORIDE 0.9 % IV SOLN
Freq: Once | INTRAVENOUS | Status: AC
  Filled 2022-12-07: qty 250

## 2022-12-07 MED ORDER — SODIUM CHLORIDE 0.9 % IV SOLN
200.0000 mg | Freq: Once | INTRAVENOUS | Status: AC
  Administered 2022-12-07: 200 mg via INTRAVENOUS
  Filled 2022-12-07: qty 200

## 2022-12-28 ENCOUNTER — Ambulatory Visit: Payer: BC Managed Care – PPO | Attending: Obstetrics and Gynecology | Admitting: Physical Therapy

## 2022-12-28 ENCOUNTER — Encounter: Payer: Self-pay | Admitting: Physical Therapy

## 2022-12-28 DIAGNOSIS — M533 Sacrococcygeal disorders, not elsewhere classified: Secondary | ICD-10-CM | POA: Diagnosis present

## 2022-12-28 DIAGNOSIS — R2689 Other abnormalities of gait and mobility: Secondary | ICD-10-CM | POA: Diagnosis present

## 2022-12-28 DIAGNOSIS — R278 Other lack of coordination: Secondary | ICD-10-CM | POA: Diagnosis present
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
## 2022-12-28 NOTE — Patient Instructions (Signed)

## 2023-01-04 ENCOUNTER — Ambulatory Visit: Payer: BC Managed Care – PPO | Admitting: Physical Therapy

## 2023-01-04 DIAGNOSIS — M533 Sacrococcygeal disorders, not elsewhere classified: Secondary | ICD-10-CM | POA: Diagnosis not present

## 2023-01-04 DIAGNOSIS — R278 Other lack of coordination: Secondary | ICD-10-CM

## 2023-01-04 DIAGNOSIS — R2689 Other abnormalities of gait and mobility: Secondary | ICD-10-CM

## 2023-01-04 NOTE — Patient Instructions (Addendum)
   _________  Feet slides :  with pedicure spreader upside    Points of contact at sitting bones  Four points of contact of foot,  Side knee back while keeping knee out along 2-3rd toe line   Heel up, ankle not twist out Lower heel while keeping knee out along 2-3rd toe line Four points of contact of foot, Slide foot back while keeping knee out along 2-3rd toe line   Repeated with other foot   2 min   __  Heel raises  Both  20 reps each , lower slow, not on heels    with pedicure spreader upside   __  Standing posture awareness 4 points in feet, lift arch, center of mass over ballmounds not heels   __   flip flop no more
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
## 2023-01-07 ENCOUNTER — Ambulatory Visit: Payer: BC Managed Care – PPO | Admitting: Physical Therapy

## 2023-01-11 ENCOUNTER — Ambulatory Visit: Payer: BC Managed Care – PPO | Attending: Obstetrics and Gynecology | Admitting: Physical Therapy

## 2023-01-11 DIAGNOSIS — R278 Other lack of coordination: Secondary | ICD-10-CM | POA: Diagnosis present

## 2023-01-11 DIAGNOSIS — R2689 Other abnormalities of gait and mobility: Secondary | ICD-10-CM | POA: Insufficient documentation

## 2023-01-11 DIAGNOSIS — M533 Sacrococcygeal disorders, not elsewhere classified: Secondary | ICD-10-CM | POA: Insufficient documentation

## 2023-01-11 NOTE — Patient Instructions (Signed)
BREATHING TO MAKE THROUGHOUT STRETCHES And notice in the day  1)  Side of hip stretch:  both sides   Reclined twist for hips and side of the hips/ legs  Lay on your back, knees bend Scoot hips to the R , leave shoulders in place Wobble knees to the L side 45 deg and to midline  Grab the R thigh with L hand to bring knee toward chest and armpit  10 reps  __  2)  Happy baby, knees apart  Alternating bent knee to stretch hamstrings, toes point to floor  10 reps each    3)   Mermaid stretch  Rocking while seated on the floor with heels to one side of the hip Heels to one side of the hip  Rock forward towards the knee that is bent , rock beck towards the opposite sitting bones   __  Strengthen foot arches and External rotation knees Strengthening feet arches:    Heel raises - heels together, minisquat  Minisquat motion, trunk bent , gaze onto floor like you are looking at your reflection over a lake/pond,  Knees bent pointed out like a "v" , navel ( center of mass) more forward  Heels together as you lift, pointed out like a "v"  KNEES ARE ALIGNED BEHIND THE TOES TO MINIMIZE STRAIN ON THE KNEES your  navel ( center of mass) more forward to a avoid dropping down fast and rocking more weight back onto heels , keep heels pressing against each other the whole time   20 reps
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
## 2023-01-18 ENCOUNTER — Ambulatory Visit: Payer: BC Managed Care – PPO | Admitting: Physical Therapy

## 2023-01-18 DIAGNOSIS — M533 Sacrococcygeal disorders, not elsewhere classified: Secondary | ICD-10-CM | POA: Diagnosis not present

## 2023-01-18 DIAGNOSIS — R2689 Other abnormalities of gait and mobility: Secondary | ICD-10-CM

## 2023-01-18 DIAGNOSIS — R278 Other lack of coordination: Secondary | ICD-10-CM

## 2023-01-18 NOTE — Patient Instructions (Signed)
   Feet slides :   Points of contact at sitting bones  Four points of contact of foot,  Side knee back while keeping knee out along 2-3rd toe line   Heel up, ankle not twist out Lower heel while keeping knee out along 2-3rd toe line Four points of contact of foot, Slide foot back while keeping knee out along 2-3rd toe line   Repeated with other foot   ___  Feet care :  Self -feet massage  in the position of figure 4 , on edge of couch. Other foot back, toe extended,   Handshake : fingers between toes, moving ballmounds/toes back and forth several times while other hand anchors at arch. Do the same at the hind/mid foot.  Heel to toes upward to a letter Big Letter T strokes to spread ballmounds and toes, several times, pinch between webs of toes  Run finger tips along top of foot between long bones "comb between the bones"    Wiggle toes and spread them out when relaxing
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
## 2023-02-01 ENCOUNTER — Ambulatory Visit: Payer: BC Managed Care – PPO

## 2023-02-01 ENCOUNTER — Ambulatory Visit: Payer: BC Managed Care – PPO | Admitting: Oncology

## 2023-02-01 ENCOUNTER — Inpatient Hospital Stay: Payer: BC Managed Care – PPO | Attending: Oncology

## 2023-02-01 ENCOUNTER — Ambulatory Visit: Payer: BC Managed Care – PPO | Admitting: Physical Therapy

## 2023-02-01 DIAGNOSIS — D509 Iron deficiency anemia, unspecified: Secondary | ICD-10-CM | POA: Diagnosis present

## 2023-02-01 DIAGNOSIS — R278 Other lack of coordination: Secondary | ICD-10-CM

## 2023-02-01 DIAGNOSIS — R2689 Other abnormalities of gait and mobility: Secondary | ICD-10-CM

## 2023-02-01 DIAGNOSIS — M533 Sacrococcygeal disorders, not elsewhere classified: Secondary | ICD-10-CM | POA: Diagnosis not present

## 2023-02-01 DIAGNOSIS — D508 Other iron deficiency anemias: Secondary | ICD-10-CM

## 2023-02-01 LAB — CBC WITH DIFFERENTIAL/PLATELET
Abs Immature Granulocytes: 0.01 10*3/uL (ref 0.00–0.07)
Basophils Absolute: 0 10*3/uL (ref 0.0–0.1)
Basophils Relative: 1 %
Eosinophils Absolute: 0.7 10*3/uL — ABNORMAL HIGH (ref 0.0–0.5)
Eosinophils Relative: 14 %
HCT: 36.3 % (ref 36.0–46.0)
Hemoglobin: 11.7 g/dL — ABNORMAL LOW (ref 12.0–15.0)
Immature Granulocytes: 0 %
Lymphocytes Relative: 33 %
Lymphs Abs: 1.7 10*3/uL (ref 0.7–4.0)
MCH: 27.8 pg (ref 26.0–34.0)
MCHC: 32.2 g/dL (ref 30.0–36.0)
MCV: 86.2 fL (ref 80.0–100.0)
Monocytes Absolute: 0.6 10*3/uL (ref 0.1–1.0)
Monocytes Relative: 11 %
Neutro Abs: 2.3 10*3/uL (ref 1.7–7.7)
Neutrophils Relative %: 41 %
Platelets: 248 10*3/uL (ref 150–400)
RBC: 4.21 MIL/uL (ref 3.87–5.11)
RDW: 17.2 % — ABNORMAL HIGH (ref 11.5–15.5)
WBC: 5.3 10*3/uL (ref 4.0–10.5)
nRBC: 0 % (ref 0.0–0.2)

## 2023-02-01 LAB — IRON AND TIBC
Iron: 46 ug/dL (ref 28–170)
Saturation Ratios: 17 % (ref 10.4–31.8)
TIBC: 273 ug/dL (ref 250–450)
UIBC: 227 ug/dL

## 2023-02-01 LAB — FERRITIN: Ferritin: 112 ng/mL (ref 11–307)

## 2023-02-01 NOTE — Patient Instructions (Signed)
    Feet slides :   Points of contact at sitting bones  Four points of contact of foot,  Side knee back while keeping knee out along 2-3rd toe line   Heel up, ankle not twist out Lower heel while keeping knee out along 2-3rd toe line Four points of contact of foot, Slide foot back while keeping knee out along 2-3rd toe line   Repeated with other foot     __  Strengthening feet arches:    Heel raises - heels together, minisquat  Minisquat motion, trunk bent , gaze onto floor like you are looking at your reflection over a lake/pond,  Knees bent pointed out like a "v" , navel ( center of mass) more forward  Heels together as you lift, pointed out like a "v"  KNEES ARE ALIGNED BEHIND THE TOES TO MINIMIZE STRAIN ON THE KNEES your  navel ( center of mass) more forward to a avoid dropping down fast and rocking more weight back onto heels , keep heels pressing against each other the whole time   30 reps  ________________  Stretch for pelvic floor    Mermaid stretch  Rocking while seated on the floor with heels to one side of the hip Heels to one side of the hip  Rock forward towards the knee that is bent , rock beck towards the opposite sitting bones  __  Minisquat:   KEEP BIG TOE BALLMOUND DOWN< MORE WEIGHT ON BACK OUTER ANKLE TO KEEP ARCH LIFTED   Scoot buttocks back slight, hinge like you are looking at your reflection on a pond  Knees behind toes,  Inhale to "smell flowers"  Exhale on the rise "like rocket"  Do not lock knees, have more weight across ballmounds of feet, toes relaxed and spread them, not grip them   10 reps x 3 x day
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
## 2023-02-15 ENCOUNTER — Encounter: Payer: Self-pay | Admitting: Oncology

## 2023-02-15 ENCOUNTER — Inpatient Hospital Stay: Payer: BC Managed Care – PPO | Attending: Oncology | Admitting: Oncology

## 2023-02-15 ENCOUNTER — Ambulatory Visit: Payer: Medicaid Other | Admitting: Physical Therapy

## 2023-02-15 ENCOUNTER — Inpatient Hospital Stay: Payer: BC Managed Care – PPO

## 2023-02-15 VITALS — BP 97/78 | HR 70 | Temp 97.8°F | Resp 18 | Wt 151.4 lb

## 2023-02-15 VITALS — BP 103/75 | HR 75 | Temp 96.7°F | Resp 18

## 2023-02-15 DIAGNOSIS — D509 Iron deficiency anemia, unspecified: Secondary | ICD-10-CM | POA: Diagnosis present

## 2023-02-15 DIAGNOSIS — D508 Other iron deficiency anemias: Secondary | ICD-10-CM

## 2023-02-15 DIAGNOSIS — D500 Iron deficiency anemia secondary to blood loss (chronic): Secondary | ICD-10-CM

## 2023-02-15 MED ORDER — SODIUM CHLORIDE 0.9 % IV SOLN
Freq: Once | INTRAVENOUS | Status: AC
  Filled 2023-02-15: qty 250

## 2023-02-15 MED ORDER — SODIUM CHLORIDE 0.9 % IV SOLN
200.0000 mg | Freq: Once | INTRAVENOUS | Status: AC
  Administered 2023-02-15: 200 mg via INTRAVENOUS
  Filled 2023-02-15: qty 200

## 2023-02-15 NOTE — Assessment & Plan Note (Addendum)
Lab Results  Component Value Date   HGB 11.7 (L) 02/01/2023   TIBC 273 02/01/2023   IRONPCTSAT 17 02/01/2023   FERRITIN 112 02/01/2023    Hemoglobin is not normalized. Iron panel has improved.  Venofer x 1 dose as maintenance.

## 2023-02-15 NOTE — Progress Notes (Signed)
Hematology/Oncology Consult note [phone] [phone]      Patient Care Team: [place] [place] [facility] as PCP - General (Family Medicine) Rickard Patience, MD as Consulting Physician (Oncology)   REFERRING PROVIDER: [place], [place] [facility]  CHIEF COMPLAINTS/REASON FOR VISIT:  Anemia  ASSESSMENT & PLAN:  IDA (iron deficiency anemia) Lab Results  Component Value Date   HGB 11.7 (L) 02/01/2023   TIBC 273 02/01/2023   IRONPCTSAT 17 02/01/2023   FERRITIN 112 02/01/2023    Hemoglobin is not normalized. Iron panel has improved.  Venofer x 1 dose as maintenance.   Orders Placed This Encounter  Procedures   CBC with Differential (Cancer Center Only)    Standing Status:   Future    Standing Expiration Date:   02/15/2024   Iron and TIBC    Standing Status:   Future    Standing Expiration Date:   02/15/2024   Ferritin    Standing Status:   Future    Standing Expiration Date:   02/15/2024   Retic Panel    Standing Status:   Future    Standing Expiration Date:   02/15/2024   Patient will follow-up in 3 months. All questions were answered. The patient knows to call the clinic with any problems, questions or concerns.  Rickard Patience, MD, PhD [facility] Hematology Oncology 02/15/2023     HISTORY OF PRESENTING ILLNESS:  Angelica Valenzuela is a  32 y.o.  female with PMH listed below who was referred to me for anemia Reviewed patient's recent labs that was done.  She was found to have abnormal CBC on 08/17/2022 with a hemoglobin of 11.2, platelet count 441. Reviewed patient's previous labs ordered by primary care physician's office, anemia is chronic onset  She had iron panel done on 05/16/2021 with TIBC 479.  Ferritin of 11. 05/04/2022, ferritin level 24. Patient has previously received 1 dose of 300 mg Venofer on 03/09/2022, administrated by her OB/GYN provider.  Recently patient has felt more fatigued. She follows up with gastroenterology for evaluation of acid  reflux, dysphagia symptoms.  She has involuntary regurgitate food and liquid.  Patient has been taking omeprazole 40 mg 1-2 times daily with no change or symptoms.  She was advised to try Protonix by GI.  She has tried and has not felt any improvement.  Currently she is off PPI. Patient has taken oral iron supplementation, she has had constipation  She denies recent chest pain on exertion, shortness of breath on minimal exertion, pre-syncopal episodes, or palpitations She had not noticed any recent bleeding such as epistaxis, hematuria or hematochezia.  She takes over-the-counter NSAIDs occasionally.  Patient has rheumatoid arthritis and a scleroderma.  Patient is on Plaquenil 200 mg twice daily.  She continues to have joint pain despite taking Plaquenil.  She is not able to tolerate oral iron supplementation due to constipation Patient delivered her son in February 2024.   INTERVAL HISTORY Angelica Valenzuela is a 32 y.o. female who has above history reviewed by me today presents for follow up visit for iron deficiency anemia. Overall she tolerates IV Venofer treatments except some tiredness after iron infusions. No other new complaints.  MEDICAL HISTORY:  Past Medical History:  Diagnosis Date   Anxiety    Depression    Hx of scleroderma    Insomnia    Polycystic disease, ovaries    Raynaud's disease     SURGICAL HISTORY: Past Surgical History:  Procedure Laterality Date   ANKLE FRACTURE SURGERY Left  BIOPSY  11/25/2022   Procedure: BIOPSY;  Surgeon: Norma Fredrickson, Boykin Nearing, MD;  Location: [facility] ENDOSCOPY;  Service: Gastroenterology;;   ESOPHAGEAL DILATION  11/25/2022   Procedure: ESOPHAGEAL DILATION;  Surgeon: Toledo, Boykin Nearing, MD;  Location[facility] ENDOSCOPY;  Service: Gastroenterology;;   ESOPHAGOGASTRODUODENOSCOPY (EGD) WITH PROPOFOL N/A 03/04/2018   Procedure: ESOPHAGOGASTRODUODENOSCOPY (EGD) WITH PROPOFOL;  Surgeon: Wyline Mood, MD;  Location: [facility] ENDOSCOPY;  Service:  Gastroenterology;  Laterality: N/A;   ESOPHAGOGASTRODUODENOSCOPY (EGD) WITH PROPOFOL N/A 11/25/2022   Procedure: ESOPHAGOGASTRODUODENOSCOPY (EGD) WITH PROPOFOL;  Surgeon: Toledo, Boykin Nearing, MD;  Location: [facility] ENDOSCOPY;  Service: Gastroenterology;  Laterality: N/A;    SOCIAL HISTORY: Social History   Socioeconomic History   Marital status: Married    Spouse name: Christain Kinghorn   Number of children: Not on file   Years of education: Not on file   Highest education level: Not on file  Occupational History   Not on file  Tobacco Use   Smoking status: Never   Smokeless tobacco: Never  Vaping Use   Vaping status: Never Used  Substance and Sexual Activity   Alcohol use: Not Currently   Drug use: Never   Sexual activity: Yes    Birth control/protection: I.U.D.  Other Topics Concern   Not on file  Social History Narrative   Not on file   Social Determinants of Health   Financial Resource Strain: Patient Declined (06/16/2022)   [facility], [facility]   Overall Financial Resource Strain (CARDIA)    Difficulty of Paying Living Expenses: Patient declined  Food Insecurity: No Food Insecurity (10/26/2022)   Hunger Vital Sign    Worried About Running Out of Food in the Last Year: Never true    Ran Out of Food in the Last Year: Never true  Transportation Needs: No Transportation Needs (10/26/2022)   PRAPARE - Administrator, Civil Service (Medical): No    Lack of Transportation (Non-Medical): No  Physical Activity: Not on file  Stress: Not on file  Social Connections: Not on file  Intimate Partner Violence: Not At Risk (10/26/2022)   Humiliation, Afraid, Rape, and Kick questionnaire    Fear of Current or Ex-Partner: No    Emotionally Abused: No    Physically Abused: No    Sexually Abused: No    FAMILY HISTORY: Family History  Problem Relation Age of Onset   Diabetes Father    Colon cancer Father     Hypertension Sister     ALLERGIES:  has No Known Allergies.  MEDICATIONS:  Current Outpatient Medications  Medication Sig Dispense Refill   aspirin EC 81 MG tablet Take 81 mg by mouth daily. Swallow whole.     folic acid (FOLVITE) 1 MG tablet Take 1 tablet by mouth daily.     hydroxychloroquine (PLAQUENIL) 200 MG tablet Take 1 tablet (200 mg total) by mouth 2 (two) times daily.  0   acetaminophen (TYLENOL) 325 MG tablet Take 2 tablets (650 mg total) by mouth every 4 (four) hours as needed (for pain scale < 4).     benzocaine-Menthol (DERMOPLAST) 20-0.5 % AERO Apply 1 Application topically as needed for irritation (perineal discomfort). (Patient not taking: Reported on 02/15/2023)     coconut oil OIL Apply 1 Application topically as needed. (Patient not taking: Reported on 02/15/2023)  0   diphenhydrAMINE (BENADRYL) 25 mg capsule Take 1 capsule (25 mg total) by mouth every 6 (six) hours as needed for itching. (Patient not  taking: Reported on 02/15/2023) 30 capsule 0   ferrous sulfate 325 (65 FE) MG tablet Take 1 tablet (325 mg total) by mouth 2 (two) times daily with a meal. (Patient not taking: Reported on 02/15/2023) 60 tablet 6   omeprazole (PRILOSEC) 40 MG capsule Take 40 mg by mouth 2 (two) times daily. (Patient not taking: Reported on 02/15/2023)     polyethylene glycol (MIRALAX) 17 g packet Take 17 g by mouth daily. (Patient not taking: Reported on 02/15/2023) 14 each 0   Prenatal Vit-Fe Fumarate-FA (M-NATAL PLUS) 27-1 MG TABS Take 1 tablet by mouth daily. (Patient not taking: Reported on 02/15/2023)     senna-docusate (SENOKOT-S) 8.6-50 MG tablet Take 2 tablets by mouth daily. (Patient not taking: Reported on 02/15/2023) 60 tablet 0   No current facility-administered medications for this visit.    Review of Systems  Constitutional:  Positive for fatigue. Negative for appetite change, chills and fever.  HENT:   Negative for hearing loss and voice change.   Eyes:  Negative for eye problems.   Respiratory:  Negative for chest tightness and cough.   Cardiovascular:  Negative for chest pain.  Gastrointestinal:  Positive for constipation. Negative for abdominal distention, abdominal pain, blood in stool and diarrhea.       Acid reflux, dysphagia, regurgitation of food and liquid.  Endocrine: Negative for hot flashes.  Genitourinary:  Negative for difficulty urinating and frequency.   Musculoskeletal:  Negative for arthralgias.  Skin:  Negative for itching and rash.  Neurological:  Negative for extremity weakness.  Hematological:  Negative for adenopathy.  Psychiatric/Behavioral:  Negative for confusion.     PHYSICAL EXAMINATION: Vitals:   02/15/23 1315  BP: 97/78  Pulse: 70  Resp: 18  Temp: 97.8 F (36.6 C)   Filed Weights   02/15/23 1315  Weight: 151 lb 6.4 oz (68.7 kg)    Physical Exam Constitutional:      General: She is not in acute distress. HENT:     Head: Normocephalic and atraumatic.  Eyes:     General: No scleral icterus. Cardiovascular:     Rate and Rhythm: Normal rate and regular rhythm.     Heart sounds: Normal heart sounds.  Pulmonary:     Effort: Pulmonary effort is normal.  Abdominal:     General: There is no distension.  Musculoskeletal:        General: No deformity. Normal range of motion.     Cervical back: Normal range of motion and neck supple.  Skin:    Findings: No rash.  Neurological:     Mental Status: She is alert and oriented to person, place, and time. Mental status is at baseline.  Psychiatric:        Mood and Affect: Mood normal.      LABORATORY DATA:  I have reviewed the data as listed    Latest Ref Rng & Units 02/01/2023    1:47 PM 10/26/2022   11:38 AM 08/17/2022   11:52 PM  CBC  WBC 4.0 - 10.5 K/uL 5.3  6.5  9.2   Hemoglobin 12.0 - 15.0 g/dL 16.1  09.6  04.5   Hematocrit 36.0 - 46.0 % 36.3  38.6  35.5   Platelets 150 - 400 K/uL 248  325  441       Latest Ref Rng & Units 08/17/2022   11:52 PM 08/10/2022    3:49  PM 08/01/2022    6:09 AM  CMP  Glucose 70 - 99 mg/dL  93  103  91   BUN 6 - 20 mg/dL 14  10  9    Creatinine 0.44 - 1.00 mg/dL 6.94  8.54  6.27   Sodium 135 - 145 mmol/L 137  136  137   Potassium 3.5 - 5.1 mmol/L 4.0  4.0  3.6   Chloride 98 - 111 mmol/L 102  103  107   CO2 22 - 32 mmol/L 27  22  21    Calcium 8.9 - 10.3 mg/dL 8.5  8.6  8.5   Total Protein 6.5 - 8.1 g/dL 7.3   6.7   Total Bilirubin 0.3 - 1.2 mg/dL 0.9   0.4   Alkaline Phos 38 - 126 U/L 187   109   AST 15 - 41 U/L 41   28   ALT 0 - 44 U/L 57   24    Lab Results  Component Value Date   IRON 46 02/01/2023   TIBC 273 02/01/2023   IRONPCTSAT 17 02/01/2023   FERRITIN 112 02/01/2023     RADIOGRAPHIC STUDIES: I have personally reviewed the radiological images as listed and agreed with the findings in the report. No results found.

## 2023-02-15 NOTE — Progress Notes (Signed)
Pt here for follow up. Pt reports feeling tired again after completing infusions.

## 2023-02-22 ENCOUNTER — Ambulatory Visit: Payer: Medicaid Other | Admitting: Physical Therapy

## 2023-03-18 ENCOUNTER — Encounter: Payer: Self-pay | Admitting: Oncology

## 2023-03-29 ENCOUNTER — Ambulatory Visit: Payer: Medicaid Other | Admitting: Physical Therapy

## 2023-04-05 ENCOUNTER — Ambulatory Visit: Payer: Medicaid Other | Attending: Obstetrics and Gynecology | Admitting: Physical Therapy

## 2023-04-05 DIAGNOSIS — R2689 Other abnormalities of gait and mobility: Secondary | ICD-10-CM

## 2023-04-05 DIAGNOSIS — M533 Sacrococcygeal disorders, not elsewhere classified: Secondary | ICD-10-CM

## 2023-04-05 DIAGNOSIS — R278 Other lack of coordination: Secondary | ICD-10-CM | POA: Diagnosis present
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
## 2023-04-05 NOTE — Patient Instructions (Addendum)
     ____        Letter T, [place] on one leg with band band under L foot, wrap by big toe then, outer knee/ thigh, L hand pulling ( elbow by side)  Plant [place], toes spread, thigh out against band,, tracking knee in line with 2-3rd toe line Navel over knee , knee over [place]  Dipping forward, R foot lifts slight ( whole body like a see saw) off floor or  Press back foot against wall 20 reps  Both    __    Avoid holding your breath when Getting out of the chair:  Scoot to front part of chair chair Heels behind knees, feet are hip width apart SLIGHTLY TURNED OUT WITH KNEES< , nose over toes  Inhale like you are smelling roses Exhale to stand WEIGHT ACROSS BALLMOUNDS and back heel outside ( to avoid collapse medial arch)

## 2023-04-12 ENCOUNTER — Ambulatory Visit: Payer: Medicaid Other | Attending: Obstetrics and Gynecology | Admitting: Physical Therapy

## 2023-04-12 DIAGNOSIS — M533 Sacrococcygeal disorders, not elsewhere classified: Secondary | ICD-10-CM | POA: Insufficient documentation

## 2023-04-12 DIAGNOSIS — R2689 Other abnormalities of gait and mobility: Secondary | ICD-10-CM | POA: Diagnosis present

## 2023-04-12 DIAGNOSIS — R278 Other lack of coordination: Secondary | ICD-10-CM | POA: Insufficient documentation
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
## 2023-04-12 NOTE — Patient Instructions (Signed)
   Clam Shell 45 Degrees  Lying with hips and knees bent 45, one pillow between knees and ankles. Heel together, toes apart like ballerina,  Lift knee with exhale while pressing heels together. Be sure pelvis does not roll backward. Do not arch back. Do 20 times, R,  30 on L ,   2 times per day.    Complimentary stretch:    Sit at 45 deg turn with R leg and knee on edge of chair/ bench, L buttock hanging off the edge to bring the L foot back like a lunge, toes bent, lower heel to feel quad stretch,  pay attention to keeping pinky and first toe ballmound planted to align toes forward so ankles are not twerked   Repeat with other side   ___

## 2023-04-26 ENCOUNTER — Ambulatory Visit: Payer: Medicaid Other | Admitting: Physical Therapy

## 2023-04-26 DIAGNOSIS — M533 Sacrococcygeal disorders, not elsewhere classified: Secondary | ICD-10-CM | POA: Diagnosis not present

## 2023-04-26 DIAGNOSIS — R278 Other lack of coordination: Secondary | ICD-10-CM

## 2023-04-26 DIAGNOSIS — R2689 Other abnormalities of gait and mobility: Secondary | ICD-10-CM
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
## 2023-04-26 NOTE — Patient Instructions (Signed)
Deep core level 1-2 ( handout)   Find anterior tilt of pelvic

## 2023-05-03 ENCOUNTER — Ambulatory Visit: Payer: Medicaid Other | Admitting: Physical Therapy

## 2023-05-10 ENCOUNTER — Encounter: Payer: BC Managed Care – PPO | Admitting: Physical Therapy

## 2023-05-10 ENCOUNTER — Ambulatory Visit: Payer: Medicaid Other | Admitting: Physical Therapy

## 2023-05-17 ENCOUNTER — Encounter: Payer: BC Managed Care – PPO | Admitting: Physical Therapy

## 2023-05-17 ENCOUNTER — Inpatient Hospital Stay: Payer: Medicaid Other | Attending: Oncology

## 2023-05-17 DIAGNOSIS — D509 Iron deficiency anemia, unspecified: Secondary | ICD-10-CM | POA: Insufficient documentation

## 2023-05-17 DIAGNOSIS — D500 Iron deficiency anemia secondary to blood loss (chronic): Secondary | ICD-10-CM

## 2023-05-17 LAB — CBC WITH DIFFERENTIAL (CANCER CENTER ONLY)
Abs Immature Granulocytes: 0.01 10*3/uL (ref 0.00–0.07)
Basophils Absolute: 0 10*3/uL (ref 0.0–0.1)
Basophils Relative: 1 %
Eosinophils Absolute: 0.3 10*3/uL (ref 0.0–0.5)
Eosinophils Relative: 6 %
HCT: 37.5 % (ref 36.0–46.0)
Hemoglobin: 12.3 g/dL (ref 12.0–15.0)
Immature Granulocytes: 0 %
Lymphocytes Relative: 39 %
Lymphs Abs: 1.8 10*3/uL (ref 0.7–4.0)
MCH: 29.5 pg (ref 26.0–34.0)
MCHC: 32.8 g/dL (ref 30.0–36.0)
MCV: 89.9 fL (ref 80.0–100.0)
Monocytes Absolute: 0.4 10*3/uL (ref 0.1–1.0)
Monocytes Relative: 9 %
Neutro Abs: 2.1 10*3/uL (ref 1.7–7.7)
Neutrophils Relative %: 45 %
Platelet Count: 327 10*3/uL (ref 150–400)
RBC: 4.17 MIL/uL (ref 3.87–5.11)
RDW: 12.3 % (ref 11.5–15.5)
WBC Count: 4.6 10*3/uL (ref 4.0–10.5)
nRBC: 0 % (ref 0.0–0.2)

## 2023-05-17 LAB — RETIC PANEL
Immature Retic Fract: 1 % — ABNORMAL LOW (ref 2.3–15.9)
RBC.: 4.15 MIL/uL (ref 3.87–5.11)
Retic Count, Absolute: 44.4 10*3/uL (ref 19.0–186.0)
Retic Ct Pct: 1.1 % (ref 0.4–3.1)
Reticulocyte Hemoglobin: 32.1 pg (ref >27.9)

## 2023-05-17 LAB — IRON AND TIBC
Iron: 88 ug/dL (ref 28–170)
Saturation Ratios: 23 % (ref 10.4–31.8)
TIBC: 379 ug/dL (ref 250–450)
UIBC: 291 ug/dL

## 2023-05-17 LAB — FERRITIN: Ferritin: 140 ng/mL (ref 11–307)

## 2023-05-19 ENCOUNTER — Inpatient Hospital Stay: Payer: Medicaid Other

## 2023-05-19 ENCOUNTER — Inpatient Hospital Stay: Payer: Medicaid Other | Admitting: Oncology

## 2023-06-21 ENCOUNTER — Ambulatory Visit: Payer: Medicaid Other | Admitting: Physical Therapy

## 2023-06-28 ENCOUNTER — Ambulatory Visit: Payer: Medicaid Other

## 2023-06-28 ENCOUNTER — Ambulatory Visit: Payer: Medicaid Other | Admitting: Oncology

## 2023-06-30 ENCOUNTER — Encounter: Payer: Self-pay | Admitting: Physical Therapy

## 2023-06-30 DIAGNOSIS — R278 Other lack of coordination: Secondary | ICD-10-CM

## 2023-06-30 DIAGNOSIS — M533 Sacrococcygeal disorders, not elsewhere classified: Secondary | ICD-10-CM

## 2023-06-30 DIAGNOSIS — R2689 Other abnormalities of gait and mobility: Secondary | ICD-10-CM

## 2023-06-30 NOTE — Therapy (Unsigned)
OUTPATIENT PHYSICAL THERAPY  Discharge Summary across 8 visits   Patient Name: Angelica Valenzuela MRN: [id number] DOB:1990/10/09, 33 y.o., female Today's Date: 06/30/2023     Past Medical History:  Diagnosis Date   Anxiety    Depression    Hx of scleroderma    Insomnia    Polycystic disease, ovaries    Raynaud's disease    Past Surgical History:  Procedure Laterality Date   ANKLE FRACTURE SURGERY Left    BIOPSY  11/25/2022   Procedure: BIOPSY;  Surgeon: Norma Fredrickson, Boykin Nearing, MD;  Location: [facility] ENDOSCOPY;  Service: Gastroenterology;;   ESOPHAGEAL DILATION  11/25/2022   Procedure: ESOPHAGEAL DILATION;  Surgeon: Toledo, Boykin Nearing, MD;  Location: [facility] ENDOSCOPY;  Service: Gastroenterology;;   ESOPHAGOGASTRODUODENOSCOPY (EGD) WITH PROPOFOL N/A 03/04/2018   Procedure: ESOPHAGOGASTRODUODENOSCOPY (EGD) WITH PROPOFOL;  Surgeon: Wyline Mood, MD;  Location: [facility] ENDOSCOPY;  Service: Gastroenterology;  Laterality: N/A;   ESOPHAGOGASTRODUODENOSCOPY (EGD) WITH PROPOFOL N/A 11/25/2022   Procedure: ESOPHAGOGASTRODUODENOSCOPY (EGD) WITH PROPOFOL;  Surgeon: Toledo, Boykin Nearing, MD;  Location: [facility] ENDOSCOPY;  Service: Gastroenterology;  Laterality: N/A;   Patient Active Problem List   Diagnosis Date Noted   IDA (iron deficiency anemia) 10/26/2022   Labor and delivery, indication for care 08/01/2022   Anxiety 03/08/2022   Migraine 03/08/2022   Systemic sclerosis (HCC) 03/08/2022   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   Pyelonephritis complicating pregnancy 03/08/2022   History of pre-eclampsia in prior pregnancy, currently pregnant 02/20/2022   High-risk pregnancy 12/01/2021   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   Acute blood loss anemia 12/23/2019   Raynaud's disease without gangrene 03/26/2015   PCOS (polycystic ovarian syndrome) 03/26/2015    PCP:   REFERRING PROVIDER:Schermerhorn   REFERRING DIAG: cystocele    Rationale for Evaluation and Treatment Rehabilitation  THERAPY DIAG:  Sacrococcygeal disorders, not elsewhere classified  Other abnormalities of gait and mobility  Other lack of coordination        CLINICAL IMPRESSION/ Discharge Summary   Pt is self-d/c Pt has met 5/8 goals with the following improvements:  Levelled pelvic girdle and shoulder alignment Improved gait pattern and speed with no more foot pain   12/28/22: 1.13 m/s, hip hike R, adducted knees  04/04/23:  1.37 m/s, reciporcal gait, more Wbing on 1st metatarsal     Improved stool consistency Type 4 across >50% of the time , improved constipation which helps to minimize worsening of prolapse   Pt was working on improving pelvic floor coordination and improving anterior tilt of pelvis to help with prolapse when pt self-d/c. Pt noticed prolapse  occurs less but recently she felt a relapse when her dtr fell and she had to carry her more often.   Discussed with pt to continue with HEP to maintain improvements with mobility and pelvic coordination with deep core system. Pt voiced understanding.      OBJECTIVE IMPAIRMENTS decreased activity tolerance, decreased coordination, decreased endurance, decreased mobility, difficulty walking, decreased ROM, decreased strength, decreased safety awareness, hypomobility, increased muscle spasms, impaired flexibility, improper body mechanics, postural dysfunction.   ACTIVITY LIMITATIONS  self-care,  sleep, home chores, work tasks    PARTICIPATION LIMITATIONS:  community, ADLs, cleaning, childcaring activities    PERSONAL FACTORS        are also affecting patient's functional outcome.    REHAB POTENTIAL: Good   CLINICAL DECISION MAKING: Evolving/moderate complexity   EVALUATION COMPLEXITY: Moderate    PATIENT EDUCATION:    Education details: Showed pt  anatomy images. Explained muscles attachments/ connection, physiology of deep core system/ spinal- thoracic-pelvis-lower  kinetic chain as they relate to pt's presentation, Sx, and past Hx. Explained what and how these areas of deficits need to be restored to balance and function    See Therapeutic activity / neuromuscular re-education section  Answered pt's questions.   Person educated: Patient Education method: Explanation, Demonstration, Tactile cues, Verbal cues, and Handouts Education comprehension: verbalized understanding, returned demonstration, verbal cues required, tactile cues required, and needs further education     PLAN: PT FREQUENCY: 1x/week   PT DURATION: 10 weeks   PLANNED INTERVENTIONS: Therapeutic exercises, Therapeutic activity, Neuromuscular re-education, Balance training, Gait training, Patient/Family education, Self Care, Joint mobilization, Spinal mobilization, Moist heat, Taping, and Manual therapy, dry needling.   PLAN FOR NEXT SESSION: See clinical impression for plan     GOALS: Goals reviewed with patient? Yes  SHORT TERM GOALS: Target date: 01/25/2023    Pt will demo IND with HEP                    Baseline: Not IND            Goal status: MET    LONG TERM GOALS: Target date: 06/14/2023   1.Pt will demo proper deep core coordination without chest breathing and optimal excursion of diaphragm/pelvic floor in order to promote spinal stability and pelvic floor function  Baseline: dyscoordination Goal status: Ongoing   2.  Pt will demo > 5 pt change on FOTO  to improve QOL and function  PFDI Prolapse  baseline -  58 pts  -> 27  Lower score = better function  Goal status: MET   3.  Pt will demo proper body mechanics in against gravity tasks and ADLs  work tasks, fitness  to minimize straining pelvic floor / back                  Baseline: not IND, improper form that places strain on pelvic floor                Goal status: MET     4. Pt will report improved stool consistency Type 4  across . 50% of the time  in order to minimize straining/ worsening of prolapse   Baseline: Bowel movements occur every 2-3 xdays. Type 1 consistency across 90% of the time, Type 3 10% of the time,  Goal status: MET     5. Pt will demo levelled pelvic girdle and shoulder height in order to progress to deep core strengthening HEP and restore mobility at spine, pelvis, gait, posture minimize falls, and improve balance   Baseline:  R shoulder/ iliac crest lowered  Goal status:MET   6. Pt will demo increased gait speed > 1.3 m/s in order to ambulate safely in community and return to fitness routine  Baseline:  1.13 m/s, hip hike R, adducted knees Goal status: MET ( 04/04/23:  1.37 m/s, reciporcal gait, adducted knees, more Wbing on 1st metatarsal )   7. Pt will demo proper coordination and circumferential/ sequential contraction of pelvic floor to improve prolapse  Baseline:  04/26/23 : standing: pelvic organs inside introitus,  in hooklying, lowered urethra inside introitus, dyscoordination of pelvic floor with overuse of ab mm. psoterior pelvic tilt  Goal status: Not met                           8. Pt will report  no more straining with bowel movements in order to minimize worsening of prolapse  Baseline:  straining                         Goal status:    Not met     Mariane Masters, PT 06/30/2023, 10:26 AM

## 2023-07-05 ENCOUNTER — Encounter: Payer: Medicaid Other | Admitting: Physical Therapy

## 2023-07-06 ENCOUNTER — Telehealth: Payer: Self-pay | Admitting: Licensed Clinical Social Worker

## 2023-07-06 NOTE — Telephone Encounter (Signed)
LCSW called patient to follow up on message from clerical. LCSW discussed virtual services with patient and instructed patient to come in to sign [facility] consent and call LCSW to schedule appointment.

## 2023-07-19 ENCOUNTER — Encounter: Payer: Self-pay | Admitting: Oncology

## 2023-07-23 ENCOUNTER — Other Ambulatory Visit (INDEPENDENT_AMBULATORY_CARE_PROVIDER_SITE_OTHER): Payer: Self-pay | Admitting: Nurse Practitioner

## 2023-07-23 DIAGNOSIS — M79601 Pain in right arm: Secondary | ICD-10-CM

## 2023-07-23 DIAGNOSIS — I7300 Raynaud's syndrome without gangrene: Secondary | ICD-10-CM

## 2023-07-26 ENCOUNTER — Ambulatory Visit (INDEPENDENT_AMBULATORY_CARE_PROVIDER_SITE_OTHER): Payer: Medicaid Other

## 2023-07-26 ENCOUNTER — Encounter (INDEPENDENT_AMBULATORY_CARE_PROVIDER_SITE_OTHER): Payer: Self-pay | Admitting: Nurse Practitioner

## 2023-07-26 ENCOUNTER — Ambulatory Visit (INDEPENDENT_AMBULATORY_CARE_PROVIDER_SITE_OTHER): Payer: Medicaid Other | Admitting: Nurse Practitioner

## 2023-07-26 VITALS — BP 105/73 | HR 81 | Resp 18 | Ht 65.0 in | Wt 132.4 lb

## 2023-07-26 DIAGNOSIS — M0579 Rheumatoid arthritis with rheumatoid factor of multiple sites without organ or systems involvement: Secondary | ICD-10-CM

## 2023-07-26 DIAGNOSIS — M79601 Pain in right arm: Secondary | ICD-10-CM | POA: Diagnosis not present

## 2023-07-26 DIAGNOSIS — I7300 Raynaud's syndrome without gangrene: Secondary | ICD-10-CM | POA: Diagnosis not present

## 2023-07-26 MED ORDER — SILDENAFIL CITRATE 20 MG PO TABS: 40.0000 mg | ORAL_TABLET | Freq: Two times a day (BID) | ORAL | 0 refills | Status: DC

## 2023-07-26 NOTE — Progress Notes (Signed)
Subjective:    Patient ID: Angelica Valenzuela, female    DOB: 05-Dec-1990, 33 y.o.   MRN: [id number] Chief Complaint  Patient presents with   New Patient (Initial Visit)    NP. UE Doppler/consult. severe Raynauds. pain/swelling RUE. patel    The patient is a 33 year old female who presents today as a referral from her rheumatologist, Dr. Allena Katz.  She has a history of rheumatoid arthritis as well as scleroderma.  In addition she has Raynaud's disease and has recently had worsening pain and swelling in her right hand.  She notes that the pain and swelling she notes were attributed to the calcium deposits that she gets in her hands.  These do create small wounds that appear to be in very stages of healing.  She has tried amlodipine to no relief.  She is currently on room audio but that is not been helpful for her.  She uses nitroglycerin on her hands but that too is also painful and not practical for her to utilize daily.  Today she underwent noninvasive studies which shows adequate large vessel perfusion from her brachial to radial/ulnar arteries.  There is no evidence of significant occlusion.  However her waveforms on her hands show abnormalities.  The PPG tracings on the fourth and fifth finger of her left hand show diminished amplitude with the tracings from her second third fourth and fifth on her right also showing diminished waveforms.    Review of Systems  Musculoskeletal:  Positive for arthralgias.  Skin:  Positive for wound.  All other systems reviewed and are negative.      Objective:   Physical Exam Vitals reviewed.  HENT:     Head: Normocephalic.  Cardiovascular:     Rate and Rhythm: Normal rate.  Pulmonary:     Effort: Pulmonary effort is normal.  Skin:    General: Skin is warm and dry.  Neurological:     Mental Status: She is alert and oriented to person, place, and time.  Psychiatric:        Mood and Affect: Mood normal.        Behavior: Behavior normal.        Thought  Content: Thought content normal.        Judgment: Judgment normal.     BP 105/73   Pulse 81   Resp 18   Ht 5\' 5"  (1.651 m)   Wt 132 lb 6.4 oz (60.1 kg)   BMI 22.03 kg/m   Past Medical History:  Diagnosis Date   Anxiety    Depression    Hx of scleroderma    Insomnia    Polycystic disease, ovaries    Raynaud's disease     Social History   Socioeconomic History   Marital status: Married    Spouse name: Christain Latino   Number of children: Not on file   Years of education: Not on file   Highest education level: Not on file  Occupational History   Not on file  Tobacco Use   Smoking status: Never   Smokeless tobacco: Never  Vaping Use   Vaping status: Never Used  Substance and Sexual Activity   Alcohol use: Not Currently   Drug use: Never   Sexual activity: Yes    Birth control/protection: I.U.D.  Other Topics Concern   Not on file  Social History Narrative   Not on file   Social Drivers of Health   Financial Resource Strain: Low Risk  (03/29/2023)   Received from  [facility]   Overall Financial Resource Strain (CARDIA)    Difficulty of Paying Living Expenses: Not hard at all  Food Insecurity: No Food Insecurity (03/29/2023)   Received from [facility]   Hunger Vital Sign    Worried About Running Out of Food in the Last Year: Never true    Ran Out of Food in the Last Year: Never true  Transportation Needs: No Transportation Needs (03/29/2023)   Received from [facility] - Transportation    In the past 12 months, has lack of transportation kept you from medical appointments or from getting medications?: No    Lack of Transportation (Non-Medical): No  Physical Activity: Not on file  Stress: Not on file  Social Connections: Not on file  Intimate Partner Violence: Not At Risk (10/26/2022)   Humiliation, Afraid, Rape, and Kick questionnaire    Fear of Current or Ex-Partner: No    Emotionally  Abused: No    Physically Abused: No    Sexually Abused: No    Past Surgical History:  Procedure Laterality Date   ANKLE FRACTURE SURGERY Left    BIOPSY  11/25/2022   Procedure: BIOPSY;  Surgeon: Norma Fredrickson, Boykin Nearing, MD;  Location: [facility] ENDOSCOPY;  Service: Gastroenterology;;   ESOPHAGEAL DILATION  11/25/2022   Procedure: ESOPHAGEAL DILATION;  Surgeon: Toledo, Boykin Nearing, MD;  Location: [facility] ENDOSCOPY;  Service: Gastroenterology;;   ESOPHAGOGASTRODUODENOSCOPY (EGD) WITH PROPOFOL N/A 03/04/2018   Procedure: ESOPHAGOGASTRODUODENOSCOPY (EGD) WITH PROPOFOL;  Surgeon: Wyline Mood, MD;  Location: [facility] ENDOSCOPY;  Service: Gastroenterology;  Laterality: N/A;   ESOPHAGOGASTRODUODENOSCOPY (EGD) WITH PROPOFOL N/A 11/25/2022   Procedure: ESOPHAGOGASTRODUODENOSCOPY (EGD) WITH PROPOFOL;  Surgeon: Toledo, Boykin Nearing, MD;  Location: [facility] ENDOSCOPY;  Service: Gastroenterology;  Laterality: N/A;    Family History  Problem Relation Age of Onset   Diabetes Father    Colon cancer Father    Hypertension Sister     No Known Allergies     Latest Ref Rng & Units 05/17/2023   10:04 AM 02/01/2023    1:47 PM 10/26/2022   11:38 AM  CBC  WBC 4.0 - 10.5 K/uL 4.6  5.3  6.5   Hemoglobin 12.0 - 15.0 g/dL 57.8  46.9  62.9   Hematocrit 36.0 - 46.0 % 37.5  36.3  38.6   Platelets 150 - 400 K/uL 327  248  325       CMP     Component Value Date/Time   NA 137 08/17/2022 2352   NA 141 08/30/2011 1923   K 4.0 08/17/2022 2352   K 3.8 08/30/2011 1923   CL 102 08/17/2022 2352   CL 107 08/30/2011 1923   CO2 27 08/17/2022 2352   CO2 21 08/30/2011 1923   GLUCOSE 93 08/17/2022 2352   GLUCOSE 70 08/30/2011 1923   BUN 14 08/17/2022 2352   BUN 7 08/30/2011 1923   CREATININE 0.60 08/17/2022 2352   CREATININE 0.59 (L) 08/30/2011 1923   CALCIUM 8.5 (L) 08/17/2022 2352   CALCIUM 8.2 (L) 08/30/2011 1923   PROT 7.3 08/17/2022 2352   ALBUMIN 3.5 08/17/2022 2352   AST 41 08/17/2022 2352   AST 28 08/30/2011 1923   ALT 57 (H)  08/17/2022 2352   ALKPHOS 187 (H) 08/17/2022 2352   BILITOT 0.9 08/17/2022 2352   GFRNONAA >60 08/17/2022 2352   GFRNONAA >60 08/30/2011 1923     No results found.     Assessment & Plan:   1.  Raynaud's disease without gangrene (Primary) Today the patient does continue to have issues with her Raynaud's disease.  Some of her issues are with rheumatoid arthritis as noted below, however she has been ongoing for audio and has not been helpful.  She is only on 20 mg daily.  We will increase to 40 mg twice per day to see if this does provide her with some symptom relief.  We also discussed a possible change to Pletal but prior to doing that we are going to increase her provide he had to see if that causes improvement as she has tolerated this fairly well.  We also discussed prophylactic therapies for people with Raynaud's disease including using warm gloves and socks.  Will have patient return in 4 weeks  2. Rheumatoid arthritis involving multiple sites with positive rheumatoid factor (HCC) Many other issues with pain the patient has is related to her calcium deposits which is not related directly to her Raynaud's but her rheumatoid arthritis instead.   Current Outpatient Medications on File Prior to Visit  Medication Sig Dispense Refill   acetaminophen (TYLENOL) 325 MG tablet Take 2 tablets (650 mg total) by mouth every 4 (four) hours as needed (for pain scale < 4).     aspirin EC 81 MG tablet Take 81 mg by mouth daily. Swallow whole.     azelastine (ASTELIN) 0.1 % nasal spray      coconut oil OIL Apply 1 Application topically as needed.  0   diphenhydrAMINE (BENADRYL) 25 mg capsule Take 1 capsule (25 mg total) by mouth every 6 (six) hours as needed for itching. 30 capsule 0   drospirenone-ethinyl estradiol (YASMIN) 3-0.03 MG tablet Take 1 tablet by mouth daily.     folic acid (FOLVITE) 1 MG tablet Take 1 tablet by mouth daily.     hydroxychloroquine (PLAQUENIL) 200 MG tablet Take 1 tablet  (200 mg total) by mouth 2 (two) times daily.  0   methotrexate (RHEUMATREX) 2.5 MG tablet Take by mouth.     omeprazole (PRILOSEC) 40 MG capsule Take 40 mg by mouth 2 (two) times daily.     pantoprazole (PROTONIX) 40 MG tablet Take 1 tablet by mouth 2 (two) times daily.     polyethylene glycol (MIRALAX) 17 g packet Take 17 g by mouth daily. 14 each 0   Prenatal Vit-Fe Fumarate-FA (M-NATAL PLUS) 27-1 MG TABS Take 1 tablet by mouth daily.     senna-docusate (SENOKOT-S) 8.6-50 MG tablet Take 2 tablets by mouth daily. 60 tablet 0   benzocaine-Menthol (DERMOPLAST) 20-0.5 % AERO Apply 1 Application topically as needed for irritation (perineal discomfort).     ferrous sulfate 325 (65 FE) MG tablet Take 1 tablet (325 mg total) by mouth 2 (two) times daily with a meal. (Patient not taking: Reported on 02/15/2023) 60 tablet 6   No current facility-administered medications on file prior to visit.    There are no Patient Instructions on file for this visit. No follow-ups on file.   Georgiana Spinner, NP

## 2023-07-26 NOTE — Progress Notes (Incomplete)
Subjective:    Patient ID: Angelica Valenzuela, female    DOB: [birth year], 33 y.o.   MRN: [id number] Chief Complaint  Patient presents with  . New Patient (Initial Visit)    NP. UE Doppler/consult. severe Raynauds. pain/swelling RUE. patel    HPI  Review of Systems     Objective:   Physical Exam  BP 105/73   Pulse 81   Resp 18   Ht 5\' 5"  (1.651 m)   Wt 132 lb 6.4 oz (60.1 kg)   BMI 22.03 kg/m   Past Medical History:  Diagnosis Date  . Anxiety   . Depression   . Hx of scleroderma   . Insomnia   . Polycystic disease, ovaries   . Raynaud's disease     Social History   Socioeconomic History  . Marital status: Married    Spouse name: Claryce Friel  . Number of children: Not on file  . Years of education: Not on file  . Highest education level: Not on file  Occupational History  . Not on file  Tobacco Use  . Smoking status: Never  . Smokeless tobacco: Never  Vaping Use  . Vaping status: Never Used  Substance and Sexual Activity  . Alcohol use: Not Currently  . Drug use: Never  . Sexual activity: Yes    Birth control/protection: I.U.D.  Other Topics Concern  . Not on file  Social History Narrative  . Not on file   Social Drivers of Health   Financial Resource Strain: Low Risk  (03/29/2023)   Received [facility]   Overall Financial Resource Strain (CARDIA)   . Difficulty of Paying Living Expenses: Not hard at all  Food Insecurity: No Food Insecurity (03/29/2023)   Received from [facility]   Hunger Vital Sign   . Worried About Programme researcher, broadcasting/film/video in the Last Year: Never true   . Ran Out of Food in the Last Year: Never true  Transportation Needs: No Transportation Needs (03/29/2023)   Received from [facility]   [facility] - [facility]   . In the past 12 months, has lack of transportation kept you from medical appointments or from getting medications?: No   . Lack of Transportation  (Non-Medical): No  Physical Activity: Not on file  Stress: Not on file  Social Connections: Not on file  Intimate Partner Violence: Not At Risk (10/26/2022)   Humiliation, Afraid, Rape, and Kick questionnaire   . Fear of Current or Ex-Partner: No   . Emotionally Abused: No   . Physically Abused: No   . Sexually Abused: No    Past Surgical History:  Procedure Laterality Date  . ANKLE FRACTURE SURGERY Left   . BIOPSY  11/25/2022   Procedure: BIOPSY;  Surgeon: Norma Fredrickson, Boykin Nearing, MD;  Location: [facility] ENDOSCOPY;  Service: Gastroenterology;;  . ESOPHAGEAL DILATION  11/25/2022   Procedure: ESOPHAGEAL DILATION;  Surgeon: Toledo, Boykin Nearing, MD;  Location: [facility] ENDOSCOPY;  Service: Gastroenterology;;  . ESOPHAGOGASTRODUODENOSCOPY (EGD) WITH PROPOFOL N/A 03/04/2018   Procedure: ESOPHAGOGASTRODUODENOSCOPY (EGD) WITH PROPOFOL;  Surgeon: Wyline Mood, MD;  Location: [facility] ENDOSCOPY;  Service: Gastroenterology;  Laterality: N/A;  . ESOPHAGOGASTRODUODENOSCOPY (EGD) WITH PROPOFOL N/A 11/25/2022   Procedure: ESOPHAGOGASTRODUODENOSCOPY (EGD) WITH PROPOFOL;  Surgeon: Toledo, Boykin Nearing, MD;  Location: [facility] ENDOSCOPY;  Service: Gastroenterology;  Laterality: N/A;    Family History  Problem Relation Age of Onset  . Diabetes Father   . Colon cancer Father   . Hypertension  Sister     No Known Allergies     Latest Ref Rng & Units 05/17/2023   10:04 AM 02/01/2023    1:47 PM 10/26/2022   11:38 AM  CBC  WBC 4.0 - 10.5 K/uL 4.6  5.3  6.5   Hemoglobin 12.0 - 15.0 g/dL 16.1  09.6  04.5   Hematocrit 36.0 - 46.0 % 37.5  36.3  38.6   Platelets 150 - 400 K/uL 327  248  325       CMP     Component Value Date/Time   NA 137 08/17/2022 2352   NA 141 08/30/2011 1923   K 4.0 08/17/2022 2352   K 3.8 08/30/2011 1923   CL 102 08/17/2022 2352   CL 107 08/30/2011 1923   CO2 27 08/17/2022 2352   CO2 21 08/30/2011 1923   GLUCOSE 93 08/17/2022 2352   GLUCOSE 70 08/30/2011 1923   BUN 14 08/17/2022 2352   BUN 7  08/30/2011 1923   CREATININE 0.60 08/17/2022 2352   CREATININE 0.59 (L) 08/30/2011 1923   CALCIUM 8.5 (L) 08/17/2022 2352   CALCIUM 8.2 (L) 08/30/2011 1923   PROT 7.3 08/17/2022 2352   ALBUMIN 3.5 08/17/2022 2352   AST 41 08/17/2022 2352   AST 28 08/30/2011 1923   ALT 57 (H) 08/17/2022 2352   ALKPHOS 187 (H) 08/17/2022 2352   BILITOT 0.9 08/17/2022 2352   GFRNONAA >60 08/17/2022 2352   GFRNONAA >60 08/30/2011 1923     No results found.     Assessment & Plan:   1. Raynaud's disease without gangrene (Primary) ***  2. Rheumatoid arthritis involving multiple sites with positive rheumatoid factor (HCC) ***   Current Outpatient Medications on File Prior to Visit  Medication Sig Dispense Refill  . acetaminophen (TYLENOL) 325 MG tablet Take 2 tablets (650 mg total) by mouth every 4 (four) hours as needed (for pain scale < 4).    . aspirin EC 81 MG tablet Take 81 mg by mouth daily. Swallow whole.    Marland Kitchen azelastine (ASTELIN) 0.1 % nasal spray     . coconut oil OIL Apply 1 Application topically as needed.  0  . diphenhydrAMINE (BENADRYL) 25 mg capsule Take 1 capsule (25 mg total) by mouth every 6 (six) hours as needed for itching. 30 capsule 0  . drospirenone-ethinyl estradiol (YASMIN) 3-0.03 MG tablet Take 1 tablet by mouth daily.    . folic acid (FOLVITE) 1 MG tablet Take 1 tablet by mouth daily.    . hydroxychloroquine (PLAQUENIL) 200 MG tablet Take 1 tablet (200 mg total) by mouth 2 (two) times daily.  0  . methotrexate (RHEUMATREX) 2.5 MG tablet Take by mouth.    Marland Kitchen omeprazole (PRILOSEC) 40 MG capsule Take 40 mg by mouth 2 (two) times daily.    . pantoprazole (PROTONIX) 40 MG tablet Take 1 tablet by mouth 2 (two) times daily.    . polyethylene glycol (MIRALAX) 17 g packet Take 17 g by mouth daily. 14 each 0  . Prenatal Vit-Fe Fumarate-FA (M-NATAL PLUS) 27-1 MG TABS Take 1 tablet by mouth daily.    Marland Kitchen senna-docusate (SENOKOT-S) 8.6-50 MG tablet Take 2 tablets by mouth daily. 60  tablet 0  . benzocaine-Menthol (DERMOPLAST) 20-0.5 % AERO Apply 1 Application topically as needed for irritation (perineal discomfort).    . ferrous sulfate 325 (65 FE) MG tablet Take 1 tablet (325 mg total) by mouth 2 (two) times daily with a meal. (Patient not taking: Reported on 02/15/2023)  60 tablet 6   No current facility-administered medications on file prior to visit.    There are no Patient Instructions on file for this visit. No follow-ups on file.   Georgiana Spinner, NP

## 2023-08-21 ENCOUNTER — Other Ambulatory Visit (INDEPENDENT_AMBULATORY_CARE_PROVIDER_SITE_OTHER): Payer: Self-pay | Admitting: Nurse Practitioner

## 2023-08-22 NOTE — Progress Notes (Signed)
 MRN : [id number]  Angelica Valenzuela is a 33 y.o. ([birth year]) female who presents with chief complaint of check circulation.  History of Present Illness:   The patient is seen for the evaluation of painful fingers and toes associated with Raynaud's changes. The patient notes the fingers and toes turned pale and then blue and become very painful. Exposure to cold environments makes the symptoms much worse. The changes have been going on for years and seemed to be much worse lately. There is no history of trauma or repetitive injury. The patient does note some peeling of the skin of the fingers in the palms and the skin of the toes on the soles.  The patient has been taking sildenafil. The patient has not been taking Norvasc  There is no history of malignancy or autoimmune disease.  No recent shortening of the patient's walking distance or new symptoms consistent with claudication.  No history of rest pain symptoms. No new ulcers or wounds of the lower extremities have occurred.  The patient denies amaurosis fugax or recent TIA symptoms. There are no recent neurological changes noted. There is no history of DVT, PE or superficial thrombophlebitis. No recent episodes of angina or shortness of breath documented.   Previous noninvasive studies which shows adequate large vessel perfusion from her brachial to radial/ulnar arteries. There is no evidence of significant occlusion. However her waveforms on her hands show abnormalities. The PPG tracings on the fourth and fifth finger of her left hand show diminished amplitude with the tracings from her second third fourth and fifth on her right also showing diminished waveforms.   No outpatient medications have been marked as taking for the 08/23/23 encounter (Appointment) with Gilda Crease, Latina Craver, MD.    Past Medical History:  Diagnosis Date   Anxiety    Depression    Hx of  scleroderma    Insomnia    Polycystic disease, ovaries    Raynaud's disease     Past Surgical History:  Procedure Laterality Date   ANKLE FRACTURE SURGERY Left    BIOPSY  11/25/2022   Procedure: BIOPSY;  Surgeon: Norma Fredrickson, Boykin Nearing, MD;  Location: [facility] ENDOSCOPY;  Service: Gastroenterology;;   ESOPHAGEAL DILATION  11/25/2022   Procedure: ESOPHAGEAL DILATION;  Surgeon: Toledo, Boykin Nearing, MD;  Location: [facility] ENDOSCOPY;  Service: Gastroenterology;;   ESOPHAGOGASTRODUODENOSCOPY (EGD) WITH PROPOFOL N/A 03/04/2018   Procedure: ESOPHAGOGASTRODUODENOSCOPY (EGD) WITH PROPOFOL;  Surgeon: Wyline Mood, MD;  Location: [facility] ENDOSCOPY;  Service: Gastroenterology;  Laterality: N/A;   ESOPHAGOGASTRODUODENOSCOPY (EGD) WITH PROPOFOL N/A 11/25/2022   Procedure: ESOPHAGOGASTRODUODENOSCOPY (EGD) WITH PROPOFOL;  Surgeon: Toledo, Boykin Nearing, MD;  Location: [facility] ENDOSCOPY;  Service: Gastroenterology;  Laterality: N/A;    Social History Social History   Tobacco Use   Smoking status: Never   Smokeless tobacco: Never  Vaping Use   Vaping status: Never Used  Substance Use Topics   Alcohol use: Not Currently   Drug use: Never    Family History Family History  Problem Relation Age of Onset   Diabetes Father    Colon cancer Father    Hypertension Sister  No Known Allergies   REVIEW OF SYSTEMS (Negative unless checked)  Constitutional: [] Weight loss  [] Fever  [] Chills Cardiac: [] Chest pain   [] Chest pressure   [] Palpitations   [] Shortness of breath when laying flat   [] Shortness of breath with exertion. Vascular:  [x] Pain in legs with walking   [] Pain in legs at rest  [] History of DVT   [] Phlebitis   [] Swelling in legs   [] Varicose veins   [] Non-healing ulcers Pulmonary:   [] Uses home oxygen   [] Productive cough   [] Hemoptysis   [] Wheeze  [] COPD   [] Asthma Neurologic:  [] Dizziness   [] Seizures   [] History of stroke   [] History of TIA  [] Aphasia   [] Vissual changes   [] Weakness or numbness in arm    [] Weakness or numbness in leg Musculoskeletal:   [] Joint swelling   [x] Joint pain   [] Low back pain Hematologic:  [] Easy bruising  [] Easy bleeding   [] Hypercoagulable state   [] Anemic Gastrointestinal:  [] Diarrhea   [] Vomiting  [] Gastroesophageal reflux/heartburn   [] Difficulty swallowing. Genitourinary:  [] Chronic kidney disease   [] Difficult urination  [] Frequent urination   [] Blood in urine Skin:  [] Rashes   [] Ulcers  Psychological:  [x] History of anxiety   []  History of major depression.  Physical Examination  There were no vitals filed for this visit. There is no height or weight on file to calculate BMI. Gen: WD/WN, NAD Head: [place]/AT, No temporalis wasting.  Ear/Nose/Throat: Hearing grossly intact, nares w/o erythema or drainage Eyes: PER, EOMI, sclera nonicteric.  Neck: Supple, no masses.  No bruit or JVD.  Pulmonary:  Good air movement, no audible wheezing, no use of accessory muscles.  Cardiac: RRR, normal S1, S2, no Murmurs. Vascular:  mild Raynaud's changes of the finger, evidence of healed wounds on the fingertips Vessel Right Left  Radial Palpable Palpable  Ulnar Palpable  Palpable  Brachial Palpable Palpable  Gastrointestinal: soft, non-distended. No guarding/no peritoneal signs.  Musculoskeletal: M/S 5/5 throughout.  No visible deformity.  Neurologic: CN 2-12 intact. Pain and light touch intact in extremities.  Symmetrical.  Speech is fluent. Motor exam as listed above. Psychiatric: Judgment intact, Mood & affect appropriate for pt's clinical situation. Dermatologic: No rashes or ulcers noted.  No changes consistent with cellulitis.   CBC Lab Results  Component Value Date   WBC 4.6 05/17/2023   HGB 12.3 05/17/2023   HCT 37.5 05/17/2023   MCV 89.9 05/17/2023   PLT 327 05/17/2023    BMET    Component Value Date/Time   NA 137 08/17/2022 2352   NA 141 08/30/2011 1923   K 4.0 08/17/2022 2352   K 3.8 08/30/2011 1923   CL 102 08/17/2022 2352   CL 107 08/30/2011  1923   CO2 27 08/17/2022 2352   CO2 21 08/30/2011 1923   GLUCOSE 93 08/17/2022 2352   GLUCOSE 70 08/30/2011 1923   BUN 14 08/17/2022 2352   BUN 7 08/30/2011 1923   CREATININE 0.60 08/17/2022 2352   CREATININE 0.59 (L) 08/30/2011 1923   CALCIUM 8.5 (L) 08/17/2022 2352   CALCIUM 8.2 (L) 08/30/2011 1923   GFRNONAA >60 08/17/2022 2352   GFRNONAA >60 08/30/2011 1923   GFRAA >60 03/04/2018 1042   GFRAA >60 08/30/2011 1923   CrCl cannot be calculated (Patient's most recent lab result is older than the maximum 21 days allowed.).  COAG No results found for: "INR", "PROTIME"  Radiology VAS [place] DOP BILAT COMP TOS DIGITS REYNAUD Result Date: 07/29/2023 UPPER EXTREMITY DOPPLER STUDY Patient Name:  Ferdinand Lango  Date of Exam:   07/26/2023 Medical Rec #: [id number]         Accession #:    [id number] Date of Birth: 1990-09-20          Patient Gender: F Patient Age:   3 years Exam Location:  [facility] Procedure:      VAS UE DOPPLER BILAT/COMP TOS, DIGITS (TO&UE REYNAUDS) Referring Phys: Sheppard Plumber --------------------------------------------------------------------------------  History: Known Raynaud's disease. Bilateral hand pain.  Risk Factors: No history of smoking. Performing Technologist: Hardie Lora RVT  Examination Guidelines: A complete evaluation includes B-mode imaging, spectral Doppler, color Doppler, and power Doppler as needed of all accessible portions of each vessel. Bilateral testing is considered an integral part of a complete examination. Limited examinations for reoccurring indications may be performed as noted.  Right Doppler Findings: +--------+--------+-----+---------+--------+ Site    PressureIndexDoppler  Comments +--------+--------+-----+---------+--------+ [id number]           triphasic         +--------+--------+-----+---------+--------+ Radial  106     1.13 triphasic         +--------+--------+-----+---------+--------+ Ulnar   105     1.12  triphasic         +--------+--------+-----+---------+--------+  Left Doppler Findings: +--------+--------+-----+---------+--------+ Site    PressureIndexDoppler  Comments +--------+--------+-----+---------+--------+ [id number]           triphasic         +--------+--------+-----+---------+--------+ Radial  109     1.16 triphasic         +--------+--------+-----+---------+--------+ Ulnar   109     1.16 triphasic         +--------+--------+-----+---------+--------+  Technologist Notes: Left: PPG tracings from the 4th and 5th finger demonstrate diminished amplitude. PPG tracings from the 2nd, 3rd, 4th and 5th finger were essentially non pulsatile.  Summary:  Right: No significant arterial obstruction detected in the right        upper extremity. Diminished digital pulses in the hand. Left: No significant arterial obstruction detected in the left upper       extremity. Diminished digital pulses in the hand. Diminished       digital pulses in the hand. *See table(s) above for measurements and observations. Electronically signed by Levora Dredge MD on 07/29/2023 at 10:11:21 AM.    Final      Assessment/Plan 1. Raynaud's disease without gangrene (Primary) The patient's Raynaud's is about the same and the symptoms are stable.  Behavioral modification was stressed; avoidance of cold and utilizing wool socks was reviewed again.  The reported BP today was at to a little bit low the AHA goal and therefore we will cannot prescribe Norvasc.  She is taking sindilafel so I do not believe that cilostazol will be of benefit although we did talk a little bit about the fact that cilostazol seems to have greater effect on the extremities.  The patient will follow up in 6 months when the weather begins to cool, sooner if there are problems.      2. Rheumatoid arthritis involving multiple sites with positive rheumatoid factor (HCC) Continue medications to treat the patient's rheumatologic  disease as already ordered, these medications have been reviewed and there are no changes at this time.  She will continue to follow-up with rheumatology as arranged.  Continued activity and therapy was stressed.    Levora Dredge, MD  08/22/2023 12:10 PM

## 2023-08-23 ENCOUNTER — Encounter (INDEPENDENT_AMBULATORY_CARE_PROVIDER_SITE_OTHER): Payer: Self-pay | Admitting: Vascular Surgery

## 2023-08-23 ENCOUNTER — Ambulatory Visit (INDEPENDENT_AMBULATORY_CARE_PROVIDER_SITE_OTHER): Payer: Medicaid Other | Admitting: Vascular Surgery

## 2023-08-23 VITALS — BP 99/72 | HR 81 | Resp 16 | Wt 127.2 lb

## 2023-08-23 DIAGNOSIS — I7300 Raynaud's syndrome without gangrene: Secondary | ICD-10-CM | POA: Diagnosis not present

## 2023-08-23 DIAGNOSIS — M0579 Rheumatoid arthritis with rheumatoid factor of multiple sites without organ or systems involvement: Secondary | ICD-10-CM | POA: Diagnosis not present

## 2023-08-30 ENCOUNTER — Other Ambulatory Visit: Payer: Self-pay

## 2023-08-30 ENCOUNTER — Inpatient Hospital Stay: Payer: Medicaid Other | Attending: Oncology

## 2023-08-30 DIAGNOSIS — Z800 Family history of malignant neoplasm of digestive organs: Secondary | ICD-10-CM | POA: Diagnosis not present

## 2023-08-30 DIAGNOSIS — D509 Iron deficiency anemia, unspecified: Secondary | ICD-10-CM | POA: Diagnosis present

## 2023-08-30 DIAGNOSIS — D500 Iron deficiency anemia secondary to blood loss (chronic): Secondary | ICD-10-CM

## 2023-08-30 DIAGNOSIS — N920 Excessive and frequent menstruation with regular cycle: Secondary | ICD-10-CM | POA: Insufficient documentation

## 2023-08-30 LAB — RETIC PANEL
Immature Retic Fract: 4 % (ref 2.3–15.9)
RBC.: 4.12 MIL/uL (ref 3.87–5.11)
Retic Count, Absolute: 21 10*3/uL (ref 19.0–186.0)
Retic Ct Pct: 0.5 % (ref 0.4–3.1)
Reticulocyte Hemoglobin: 35.7 pg (ref >27.9)

## 2023-08-30 LAB — IRON AND TIBC
Iron: 67 ug/dL (ref 28–170)
Saturation Ratios: 18 % (ref 10.4–31.8)
TIBC: 364 ug/dL (ref 250–450)
UIBC: 297 ug/dL

## 2023-08-30 LAB — CBC WITH DIFFERENTIAL (CANCER CENTER ONLY)
Abs Immature Granulocytes: 0.01 10*3/uL (ref 0.00–0.07)
Basophils Absolute: 0.1 10*3/uL (ref 0.0–0.1)
Basophils Relative: 1 %
Eosinophils Absolute: 0.8 10*3/uL — ABNORMAL HIGH (ref 0.0–0.5)
Eosinophils Relative: 14 %
HCT: 37.9 % (ref 36.0–46.0)
Hemoglobin: 12.2 g/dL (ref 12.0–15.0)
Immature Granulocytes: 0 %
Lymphocytes Relative: 32 %
Lymphs Abs: 1.9 10*3/uL (ref 0.7–4.0)
MCH: 29.7 pg (ref 26.0–34.0)
MCHC: 32.2 g/dL (ref 30.0–36.0)
MCV: 92.2 fL (ref 80.0–100.0)
Monocytes Absolute: 0.4 10*3/uL (ref 0.1–1.0)
Monocytes Relative: 7 %
Neutro Abs: 2.8 10*3/uL (ref 1.7–7.7)
Neutrophils Relative %: 46 %
Platelet Count: 331 10*3/uL (ref 150–400)
RBC: 4.11 MIL/uL (ref 3.87–5.11)
RDW: 12.9 % (ref 11.5–15.5)
WBC Count: 6 10*3/uL (ref 4.0–10.5)
nRBC: 0 % (ref 0.0–0.2)

## 2023-08-30 LAB — FERRITIN: Ferritin: 157 ng/mL (ref 11–307)

## 2023-09-05 ENCOUNTER — Encounter (INDEPENDENT_AMBULATORY_CARE_PROVIDER_SITE_OTHER): Payer: Self-pay | Admitting: Vascular Surgery

## 2023-09-06 ENCOUNTER — Encounter: Payer: Self-pay | Admitting: Oncology

## 2023-09-06 ENCOUNTER — Inpatient Hospital Stay: Payer: Medicaid Other

## 2023-09-06 ENCOUNTER — Inpatient Hospital Stay (HOSPITAL_BASED_OUTPATIENT_CLINIC_OR_DEPARTMENT_OTHER): Payer: Medicaid Other | Admitting: Oncology

## 2023-09-06 VITALS — BP 99/71 | HR 72 | Temp 97.6°F | Resp 18 | Wt 124.8 lb

## 2023-09-06 DIAGNOSIS — D500 Iron deficiency anemia secondary to blood loss (chronic): Secondary | ICD-10-CM | POA: Diagnosis not present

## 2023-09-06 DIAGNOSIS — N920 Excessive and frequent menstruation with regular cycle: Secondary | ICD-10-CM | POA: Diagnosis not present

## 2023-09-06 DIAGNOSIS — D509 Iron deficiency anemia, unspecified: Secondary | ICD-10-CM | POA: Diagnosis not present

## 2023-09-06 NOTE — Progress Notes (Signed)
 Pt here for follow up. Pt reports that she has been experiencing vertigo but is being seen by another provider for this

## 2023-09-06 NOTE — Assessment & Plan Note (Signed)
 Recommend patient follow-up with gynecology. Check von Willebrand panel.

## 2023-09-06 NOTE — Assessment & Plan Note (Addendum)
 Lab Results  Component Value Date   HGB 12.2 08/30/2023   TIBC 364 08/30/2023   IRONPCTSAT 18 08/30/2023   FERRITIN 157 08/30/2023    Hemoglobin has normalized. Iron panel has improved.  Hold off Venofer

## 2023-09-06 NOTE — Progress Notes (Addendum)
 Hematology/Oncology Consult note [phone] [phone]      Patient Care Team: Eartha Gold, MD as PCP - General (Infectious Diseases) Timmy Forbes, MD as Consulting Physician (Oncology)   REFERRING PROVIDER: [place], [place] Prim*  CHIEF COMPLAINTS/REASON FOR VISIT:  Anemia  ASSESSMENT & PLAN:  IDA (iron deficiency anemia) Lab Results  Component Value Date   HGB 12.2 08/30/2023   TIBC 364 08/30/2023   IRONPCTSAT 18 08/30/2023   FERRITIN 157 08/30/2023    Hemoglobin has normalized. Iron panel has improved.  Hold off Venofer   Menorrhagia Recommend patient follow-up with gynecology. Check von Willebrand panel. No orders of the defined types were placed in this encounter.  Patient will follow-up in 6 months. All questions were answered. The patient knows to call the clinic with any problems, questions or concerns.  Timmy Forbes, MD, PhD [facility] Hematology Oncology 09/06/2023     HISTORY OF PRESENTING ILLNESS:  Angelica Valenzuela is a  33 y.o.  female with PMH listed below who was referred to me for anemia Reviewed patient's recent labs that was done.  She was found to have abnormal CBC on 08/17/2022 with a hemoglobin of 11.2, platelet count 441. Reviewed patient's previous labs ordered by primary care physician's office, anemia is chronic onset  She had iron panel done on 05/16/2021 with TIBC 479.  Ferritin of 11. 05/04/2022, ferritin level 24. Patient has previously received 1 dose of 300 mg Venofer on 03/09/2022, administrated by her OB/GYN provider.  Recently patient has felt more fatigued. She follows up with gastroenterology for evaluation of acid reflux, dysphagia symptoms.  She has involuntary regurgitate food and liquid.  Patient has been taking omeprazole 40 mg 1-2 times daily with no change or symptoms.  She was advised to try Protonix by GI.  She has tried and has not felt any improvement.  Currently she is off PPI. Patient has  taken oral iron supplementation, she has had constipation  She denies recent chest pain on exertion, shortness of breath on minimal exertion, pre-syncopal episodes, or palpitations She had not noticed any recent bleeding such as epistaxis, hematuria or hematochezia.  She takes over-the-counter NSAIDs occasionally.  Patient has rheumatoid arthritis and a scleroderma.  Patient is on Plaquenil 200 mg twice daily.  She continues to have joint pain despite taking Plaquenil.  She is not able to tolerate oral iron supplementation due to constipation Patient delivered her son in February 2024.   INTERVAL HISTORY Angelica Valenzuela is a 33 y.o. female who has above history reviewed by me today presents for follow up visit for iron deficiency anemia. Patient had IUD removed.  Patient reports that she is currently off birth control pills and there is a plan to go back on OCP.  She denies history of heavy menstrual bleeding as a teenager.  MEDICAL HISTORY:  Past Medical History:  Diagnosis Date   Anxiety    Depression    Hx of scleroderma    Insomnia    Polycystic disease, ovaries    Raynaud's disease     SURGICAL HISTORY: Past Surgical History:  Procedure Laterality Date   ANKLE FRACTURE SURGERY Left    BIOPSY  11/25/2022   Procedure: BIOPSY;  Surgeon: Corky Diener, Alphonsus Jeans, MD;  Location: [facility] ENDOSCOPY;  Service: Gastroenterology;;   ESOPHAGEAL DILATION  11/25/2022   Procedure: ESOPHAGEAL DILATION;  Surgeon: Corky Diener, Teodoro K, MD;  Location: [facility] ENDOSCOPY;  Service: Gastroenterology;;   ESOPHAGOGASTRODUODENOSCOPY (EGD) WITH PROPOFOL N/A 03/04/2018   Procedure: ESOPHAGOGASTRODUODENOSCOPY (EGD)  WITH PROPOFOL;  Surgeon: Luke Salaam, MD;  Location: [facility] ENDOSCOPY;  Service: Gastroenterology;  Laterality: N/A;   ESOPHAGOGASTRODUODENOSCOPY (EGD) WITH PROPOFOL N/A 11/25/2022   Procedure: ESOPHAGOGASTRODUODENOSCOPY (EGD) WITH PROPOFOL;  Surgeon: Toledo, Alphonsus Jeans, MD;  Location: [facility] ENDOSCOPY;   Service: Gastroenterology;  Laterality: N/A;    SOCIAL HISTORY: Social History   Socioeconomic History   Marital status: Married    Spouse name: Christain Villaflor   Number of children: Not on file   Years of education: Not on file   Highest education level: Not on file  Occupational History   Not on file  Tobacco Use   Smoking status: Never   Smokeless tobacco: Never  Vaping Use   Vaping status: Never Used  Substance and Sexual Activity   Alcohol use: Not Currently   Drug use: Never   Sexual activity: Yes    Birth control/protection: I.U.D.  Other Topics Concern   Not on file  Social History Narrative   Not on file   Social Drivers of Health   Financial Resource Strain: Low Risk  (03/29/2023)   Received from [facility]   Overall Financial Resource Strain (CARDIA)    Difficulty of Paying Living Expenses: Not hard at all  Food Insecurity: No Food Insecurity (03/29/2023)   Received from [facility]   Hunger Vital Sign    Worried About Running Out of Food in the Last Year: Never true    Ran Out of Food in the Last Year: Never true  Transportation Needs: No Transportation Needs (03/29/2023)   Received from [facility] - Transportation    In the past 12 months, has lack of transportation kept you from medical appointments or from getting medications?: No    Lack of Transportation (Non-Medical): No  Physical Activity: Not on file  Stress: Not on file  Social Connections: Not on file  Intimate Partner Violence: Not At Risk (10/26/2022)   Humiliation, Afraid, Rape, and Kick questionnaire    Fear of Current or Ex-Partner: No    Emotionally Abused: No    Physically Abused: No    Sexually Abused: No    FAMILY HISTORY: Family History  Problem Relation Age of Onset   Diabetes Father    Colon cancer Father    Hypertension Sister     ALLERGIES:  has no known allergies.  MEDICATIONS:  Current Outpatient  Medications  Medication Sig Dispense Refill   acetaminophen (TYLENOL) 325 MG tablet Take 2 tablets (650 mg total) by mouth every 4 (four) hours as needed (for pain scale < 4).     aspirin EC 81 MG tablet Take 81 mg by mouth daily. Swallow whole.     azelastine (ASTELIN) 0.1 % nasal spray      benzocaine-Menthol (DERMOPLAST) 20-0.5 % AERO Apply 1 Application topically as needed for irritation (perineal discomfort).     drospirenone-ethinyl estradiol (YASMIN) 3-0.03 MG tablet Take 1 tablet by mouth daily.     folic acid (FOLVITE) 1 MG tablet Take 1 tablet by mouth daily.     hydroxychloroquine (PLAQUENIL) 200 MG tablet Take 1 tablet (200 mg total) by mouth 2 (two) times daily.  0   methotrexate (RHEUMATREX) 2.5 MG tablet Take by mouth.     NITRO-BID 2 % ointment Apply 1 inch topically 4 (four) times daily.     omeprazole (PRILOSEC) 40 MG capsule Take 40 mg by mouth 2 (two) times daily.     coconut oil OIL  Apply 1 Application topically as needed. (Patient not taking: Reported on 09/06/2023)  0   diphenhydrAMINE (BENADRYL) 25 mg capsule Take 1 capsule (25 mg total) by mouth every 6 (six) hours as needed for itching. (Patient not taking: Reported on 09/06/2023) 30 capsule 0   ferrous sulfate 325 (65 FE) MG tablet Take 1 tablet (325 mg total) by mouth 2 (two) times daily with a meal. (Patient not taking: Reported on 09/06/2023) 60 tablet 6   polyethylene glycol (MIRALAX) 17 g packet Take 17 g by mouth daily. (Patient not taking: Reported on 09/06/2023) 14 each 0   Prenatal Vit-Fe Fumarate-FA (M-NATAL PLUS) 27-1 MG TABS Take 1 tablet by mouth daily. (Patient not taking: Reported on 09/06/2023)     senna-docusate (SENOKOT-S) 8.6-50 MG tablet Take 2 tablets by mouth daily. (Patient not taking: Reported on 09/06/2023) 60 tablet 0   sildenafil (REVATIO) 20 MG tablet TAKE 2 TABLETS BY MOUTH 2 TIMES DAILY. (Patient not taking: Reported on 09/06/2023) 360 tablet 1   No current facility-administered medications for  this visit.    Review of Systems  Constitutional:  Negative for appetite change, chills, fatigue and fever.  HENT:   Negative for hearing loss and voice change.   Eyes:  Negative for eye problems.  Respiratory:  Negative for chest tightness and cough.   Cardiovascular:  Negative for chest pain.  Gastrointestinal:  Negative for abdominal distention, abdominal pain, blood in stool and diarrhea.  Endocrine: Negative for hot flashes.  Genitourinary:  Positive for menstrual problem. Negative for difficulty urinating and frequency.   Musculoskeletal:  Negative for arthralgias.  Skin:  Negative for itching and rash.  Neurological:  Negative for extremity weakness.  Hematological:  Negative for adenopathy.  Psychiatric/Behavioral:  Negative for confusion.     PHYSICAL EXAMINATION: Vitals:   09/06/23 1005  BP: 99/71  Pulse: 72  Resp: 18  Temp: 97.6 F (36.4 C)   Filed Weights   09/06/23 1005  Weight: 124 lb 12.8 oz (56.6 kg)    Physical Exam Constitutional:      General: She is not in acute distress. HENT:     Head: Normocephalic and atraumatic.  Eyes:     General: No scleral icterus. Cardiovascular:     Rate and Rhythm: Normal rate and regular rhythm.     Heart sounds: Normal heart sounds.  Pulmonary:     Effort: Pulmonary effort is normal.  Abdominal:     General: There is no distension.  Musculoskeletal:        General: No deformity. Normal range of motion.     Cervical back: Normal range of motion and neck supple.  Skin:    Findings: No rash.  Neurological:     Mental Status: She is alert and oriented to person, place, and time. Mental status is at baseline.  Psychiatric:        Mood and Affect: Mood normal.      LABORATORY DATA:  I have reviewed the data as listed    Latest Ref Rng & Units 08/30/2023   10:11 AM 05/17/2023   10:04 AM 02/01/2023    1:47 PM  CBC  WBC 4.0 - 10.5 K/uL 6.0  4.6  5.3   Hemoglobin 12.0 - 15.0 g/dL 95.6  21.3  08.6   Hematocrit  36.0 - 46.0 % 37.9  37.5  36.3   Platelets 150 - 400 K/uL 331  327  248       Latest Ref Rng & Units 08/17/2022  11:52 PM 08/10/2022    3:49 PM 08/01/2022    6:09 AM  CMP  Glucose 70 - 99 mg/dL 93  914  91   BUN 6 - 20 mg/dL 14  10  9    Creatinine 0.44 - 1.00 mg/dL 7.82  9.56  2.13   Sodium 135 - 145 mmol/L 137  136  137   Potassium 3.5 - 5.1 mmol/L 4.0  4.0  3.6   Chloride 98 - 111 mmol/L 102  103  107   CO2 22 - 32 mmol/L 27  22  21    Calcium 8.9 - 10.3 mg/dL 8.5  8.6  8.5   Total Protein 6.5 - 8.1 g/dL 7.3   6.7   Total Bilirubin 0.3 - 1.2 mg/dL 0.9   0.4   Alkaline Phos 38 - 126 U/L 187   109   AST 15 - 41 U/L 41   28   ALT 0 - 44 U/L 57   24    Lab Results  Component Value Date   IRON 67 08/30/2023   TIBC 364 08/30/2023   IRONPCTSAT 18 08/30/2023   FERRITIN 157 08/30/2023     RADIOGRAPHIC STUDIES: I have personally reviewed the radiological images as listed and agreed with the findings in the report. No results found.

## 2023-09-07 ENCOUNTER — Telehealth: Payer: Self-pay

## 2023-09-07 NOTE — Telephone Encounter (Signed)
-----   Message from Rickard Patience sent at 09/06/2023  7:13 PM EDT ----- Please arrange patient to get additional blood work done. Ordered. Thanks.

## 2023-09-07 NOTE — Telephone Encounter (Signed)
 Called pt and detailed message left letting her know to contact us to set up a lab appt. Mychart message also sent

## 2023-09-13 ENCOUNTER — Inpatient Hospital Stay: Attending: Oncology

## 2023-09-13 ENCOUNTER — Other Ambulatory Visit

## 2023-09-13 DIAGNOSIS — D500 Iron deficiency anemia secondary to blood loss (chronic): Secondary | ICD-10-CM

## 2023-09-13 DIAGNOSIS — N920 Excessive and frequent menstruation with regular cycle: Secondary | ICD-10-CM | POA: Insufficient documentation

## 2023-09-13 DIAGNOSIS — D509 Iron deficiency anemia, unspecified: Secondary | ICD-10-CM | POA: Diagnosis present

## 2023-09-14 LAB — VON WILLEBRAND PANEL
Coagulation Factor VIII: 166 % — ABNORMAL HIGH (ref 56–140)
Ristocetin Co-factor, Plasma: 142 % (ref 50–200)
Von Willebrand Antigen, Plasma: 182 % (ref 50–200)

## 2023-09-14 LAB — COAG STUDIES INTERP REPORT

## 2023-10-26 ENCOUNTER — Encounter (INDEPENDENT_AMBULATORY_CARE_PROVIDER_SITE_OTHER): Payer: Self-pay

## 2024-03-20 ENCOUNTER — Ambulatory Visit (INDEPENDENT_AMBULATORY_CARE_PROVIDER_SITE_OTHER): Admitting: Vascular Surgery

## 2024-03-20 ENCOUNTER — Encounter (INDEPENDENT_AMBULATORY_CARE_PROVIDER_SITE_OTHER): Payer: Self-pay | Admitting: Vascular Surgery

## 2024-03-20 VITALS — BP 95/68 | HR 74 | Resp 18 | Ht 65.0 in | Wt 118.8 lb

## 2024-03-20 DIAGNOSIS — M0579 Rheumatoid arthritis with rheumatoid factor of multiple sites without organ or systems involvement: Secondary | ICD-10-CM | POA: Diagnosis not present

## 2024-03-20 DIAGNOSIS — G43909 Migraine, unspecified, not intractable, without status migrainosus: Secondary | ICD-10-CM

## 2024-03-20 DIAGNOSIS — I7300 Raynaud's syndrome without gangrene: Secondary | ICD-10-CM

## 2024-03-20 NOTE — Progress Notes (Signed)
 MRN : [id number]  Angelica Valenzuela is a 33 y.o. ([birth year]) female who presents with chief complaint of check circulation.  History of Present Illness:   The patient returns to the office for follow-up regarding Raynaud's changes of the fingers.  It continues to be quite painful.  She continues to have multiple ulcerations at the tips of her fingers.  She denies symptoms consistent with infection.  She is no longer taking the Revatio .  No outpatient medications have been marked as taking for the 03/20/24 encounter (Appointment) with Jama, Cordella MATSU, MD.    Past Medical History:  Diagnosis Date   Anxiety    Depression    Hx of scleroderma    Insomnia    Polycystic disease, ovaries    Raynaud's disease     Past Surgical History:  Procedure Laterality Date   ANKLE FRACTURE SURGERY Left    BIOPSY  11/25/2022   Procedure: BIOPSY;  Surgeon: Aundria, Ladell POUR, MD;  Location: [facility] ENDOSCOPY;  Service: Gastroenterology;;   ESOPHAGEAL DILATION  11/25/2022   Procedure: ESOPHAGEAL DILATION;  Surgeon: Aundria, Ladell POUR, MD;  Location: [facility] ENDOSCOPY;  Service: Gastroenterology;;   ESOPHAGOGASTRODUODENOSCOPY (EGD) WITH PROPOFOL  N/A 03/04/2018   Procedure: ESOPHAGOGASTRODUODENOSCOPY (EGD) WITH PROPOFOL ;  Surgeon: Therisa Bi, MD;  Location: [facility] ENDOSCOPY;  Service: Gastroenterology;  Laterality: N/A;   ESOPHAGOGASTRODUODENOSCOPY (EGD) WITH PROPOFOL  N/A 11/25/2022   Procedure: ESOPHAGOGASTRODUODENOSCOPY (EGD) WITH PROPOFOL ;  Surgeon: Toledo, Ladell POUR, MD;  Location: [facility] ENDOSCOPY;  Service: Gastroenterology;  Laterality: N/A;    Social History Social History   Tobacco Use   Smoking status: Never   Smokeless tobacco: Never  Vaping Use   Vaping status: Never Used  Substance Use Topics   Alcohol use: Not Currently   Drug use: Never    Family History Family History  Problem Relation Age of Onset   Diabetes Father     Colon cancer Father    Hypertension Sister     No Known Allergies   REVIEW OF SYSTEMS (Negative unless checked)  Constitutional: [] Weight loss  [] Fever  [] Chills Cardiac: [] Chest pain   [] Chest pressure   [] Palpitations   [] Shortness of breath when laying flat   [] Shortness of breath with exertion. Vascular:  [x] Pain in legs with walking   [] Pain in legs at rest  [] History of DVT   [] Phlebitis   [] Swelling in legs   [] Varicose veins   [] Non-healing ulcers Pulmonary:   [] Uses home oxygen   [] Productive cough   [] Hemoptysis   [] Wheeze  [] COPD   [] Asthma Neurologic:  [] Dizziness   [] Seizures   [] History of stroke   [] History of TIA  [] Aphasia   [] Vissual changes   [] Weakness or numbness in arm   [] Weakness or numbness in leg Musculoskeletal:   [] Joint swelling   [] Joint pain   [] Low back pain Hematologic:  [] Easy bruising  [] Easy bleeding   [] Hypercoagulable state   [] Anemic Gastrointestinal:  [] Diarrhea   [] Vomiting  [] Gastroesophageal reflux/heartburn   [] Difficulty swallowing. Genitourinary:  [] Chronic kidney disease   [] Difficult urination  [] Frequent urination   [] Blood in urine Skin:  [] Rashes   [] Ulcers  Psychological:  [] History of anxiety   []  History of major depression.  Physical Examination  There were no vitals filed for this  visit. There is no height or weight on file to calculate BMI. Gen: WD/WN, NAD Head: [place]/AT, No temporalis wasting.  Ear/Nose/Throat: Hearing grossly intact, nares w/o erythema or drainage Eyes: PER, EOMI, sclera nonicteric.  Neck: Supple, no masses.  No bruit or JVD.  Pulmonary:  Good air movement, no audible wheezing, no use of accessory muscles.  Cardiac: RRR, normal S1, S2, no Murmurs. Vascular:  mild trophic changes, no open wounds Vessel Right Left  Radial Palpable Palpable  Gastrointestinal: soft, non-distended. No guarding/no peritoneal signs.  Musculoskeletal: M/S 5/5 throughout.  No visible deformity.  Neurologic: CN 2-12 intact. Pain and  light touch intact in extremities.  Symmetrical.  Speech is fluent. Motor exam as listed above. Psychiatric: Judgment intact, Mood & affect appropriate for pt's clinical situation. Dermatologic: No rashes or ulcers noted.  No changes consistent with cellulitis.   CBC Lab Results  Component Value Date   WBC 6.0 08/30/2023   HGB 12.2 08/30/2023   HCT 37.9 08/30/2023   MCV 92.2 08/30/2023   PLT 331 08/30/2023    BMET    Component Value Date/Time   NA 137 08/17/2022 2352   NA 141 08/30/2011 1923   K 4.0 08/17/2022 2352   K 3.8 08/30/2011 1923   CL 102 08/17/2022 2352   CL 107 08/30/2011 1923   CO2 27 08/17/2022 2352   CO2 21 08/30/2011 1923   GLUCOSE 93 08/17/2022 2352   GLUCOSE 70 08/30/2011 1923   BUN 14 08/17/2022 2352   BUN 7 08/30/2011 1923   CREATININE 0.60 08/17/2022 2352   CREATININE 0.59 (L) 08/30/2011 1923   CALCIUM  8.5 (L) 08/17/2022 2352   CALCIUM  8.2 (L) 08/30/2011 1923   GFRNONAA >60 08/17/2022 2352   GFRNONAA >60 08/30/2011 1923   GFRAA >60 03/04/2018 1042   GFRAA >60 08/30/2011 1923   CrCl cannot be calculated (Patient's most recent lab result is older than the maximum 21 days allowed.).  COAG No results found for: INR, PROTIME  Radiology No results found.   Assessment/Plan 1. Raynaud's disease without gangrene (Primary) Recommend:  The patient's Raynaud's is continues and is poorly controlled and is causing significant pain in both hands.  The patient requests treatment for this.  Behavioral modification is already being done; avoidance of cold and utilizing wool socks.  She has failed Norvasc and so we will try cilostazol.  The off-label indication was discussed.  As well as the side effects particularly headache stomach upset and diarrhea.  Will begin at a low dose and see how she tolerates the medication and whether it offers her any advantage.  We will also prescribe Silvadene for the ulcerations and have discussed using an emollient such  as bag balm to help prevent the drying and cracking of her skin.  The patient will follow up in 3-4 months to assess the therapy, sooner if there are problems.    A total of 30 minutes was spent with this patient and greater than 50% was spent in counseling and coordination of care with the patient.  Discussion included the treatment options for vascular disease including indications for surgery and intervention.  Also discussed is the appropriate timing of treatment.  In addition medical therapy was discussed.  2. Rheumatoid arthritis involving multiple sites with positive rheumatoid factor (HCC) Continue anti-inflammatory medications as ordered and reviewed, no changes at this time  3. Migraine without status migrainosus, not intractable, unspecified migraine type We did discuss that headache as one of the side effects of cilostazol  but that it would not be a permanent change.  After discussion we did elect to move forward with a trial again, beginning at low-dose.    Cordella Shawl, MD  03/20/2024 9:29 AM

## 2024-03-28 ENCOUNTER — Other Ambulatory Visit (INDEPENDENT_AMBULATORY_CARE_PROVIDER_SITE_OTHER): Payer: Self-pay | Admitting: Vascular Surgery

## 2024-03-28 ENCOUNTER — Encounter (INDEPENDENT_AMBULATORY_CARE_PROVIDER_SITE_OTHER): Payer: Self-pay | Admitting: Vascular Surgery

## 2024-03-28 MED ORDER — CILOSTAZOL 100 MG PO TABS: 50.0000 mg | ORAL_TABLET | Freq: Two times a day (BID) | ORAL | 5 refills | Status: DC

## 2024-03-28 MED ORDER — SILVER SULFADIAZINE 1 % EX CREA: 1.0000 | TOPICAL_CREAM | Freq: Every day | CUTANEOUS | 1 refills | Status: AC

## 2024-04-01 ENCOUNTER — Other Ambulatory Visit (INDEPENDENT_AMBULATORY_CARE_PROVIDER_SITE_OTHER): Payer: Self-pay | Admitting: Nurse Practitioner

## 2024-05-04 ENCOUNTER — Other Ambulatory Visit (INDEPENDENT_AMBULATORY_CARE_PROVIDER_SITE_OTHER): Payer: Self-pay | Admitting: Vascular Surgery

## 2024-07-13 NOTE — Progress Notes (Unsigned)
 "                                                                      MRN : [id number]  Angelica Valenzuela is a 34 y.o. ([birth year]) female who presents with chief complaint of check circulation.  History of Present Illness:   The patient returns to the office for follow-up regarding Raynaud's changes of the fingers.  It continues to be quite painful.  She continues to have multiple ulcerations at the tips of her fingers.  She denies symptoms consistent with infection.   She is no longer taking the Revatio .  Active Medications[1]  Past Medical History:  Diagnosis Date   Anxiety    Depression    Hx of scleroderma    Insomnia    Polycystic disease, ovaries    Raynaud's disease     Past Surgical History:  Procedure Laterality Date   ANKLE FRACTURE SURGERY Left    BIOPSY  11/25/2022   Procedure: BIOPSY;  Surgeon: Aundria, Ladell POUR, MD;  Location: [facility] ENDOSCOPY;  Service: Gastroenterology;;   ESOPHAGEAL DILATION  11/25/2022   Procedure: ESOPHAGEAL DILATION;  Surgeon: Aundria, Ladell POUR, MD;  Location: [facility] ENDOSCOPY;  Service: Gastroenterology;;   ESOPHAGOGASTRODUODENOSCOPY (EGD) WITH PROPOFOL  N/A 03/04/2018   Procedure: ESOPHAGOGASTRODUODENOSCOPY (EGD) WITH PROPOFOL ;  Surgeon: Therisa Bi, MD;  Location: [facility] ENDOSCOPY;  Service: Gastroenterology;  Laterality: N/A;   ESOPHAGOGASTRODUODENOSCOPY (EGD) WITH PROPOFOL  N/A 11/25/2022   Procedure: ESOPHAGOGASTRODUODENOSCOPY (EGD) WITH PROPOFOL ;  Surgeon: Toledo, Ladell POUR, MD;  Location: [facility] ENDOSCOPY;  Service: Gastroenterology;  Laterality: N/A;    Social History Social History[2]  Family History Family History  Problem Relation Age of Onset   Diabetes Father    Colon cancer Father    Hypertension Sister     Allergies[3]   REVIEW OF SYSTEMS (Negative unless checked)  Constitutional: [] Weight loss  [] Fever  [] Chills Cardiac: [] Chest pain   [] Chest pressure   [] Palpitations   [] Shortness of breath when laying flat   [] Shortness  of breath with exertion. Vascular:  [x] Pain in legs with walking   [] Pain in legs at rest  [] History of DVT   [] Phlebitis   [] Swelling in legs   [] Varicose veins   [] Non-healing ulcers Pulmonary:   [] Uses home oxygen   [] Productive cough   [] Hemoptysis   [] Wheeze  [] COPD   [] Asthma Neurologic:  [] Dizziness   [] Seizures   [] History of stroke   [] History of TIA  [] Aphasia   [] Vissual changes   [] Weakness or numbness in arm   [] Weakness or numbness in leg Musculoskeletal:   [] Joint swelling   [] Joint pain   [] Low back pain Hematologic:  [] Easy bruising  [] Easy bleeding   [] Hypercoagulable state   [] Anemic Gastrointestinal:  [] Diarrhea   [] Vomiting  [] Gastroesophageal reflux/heartburn   [] Difficulty swallowing. Genitourinary:  [] Chronic kidney disease   [] Difficult urination  [] Frequent urination   [] Blood in urine Skin:  [] Rashes   [] Ulcers  Psychological:  [] History of anxiety   []  History of major depression.  Physical Examination  There were no vitals filed for this visit. There is no height or weight on file to calculate BMI. Gen: WD/WN, NAD Head: [place]/AT, No temporalis wasting.  Ear/Nose/Throat: Hearing grossly intact, nares w/o erythema or drainage Eyes:  PER, EOMI, sclera nonicteric.  Neck: Supple, no masses.  No bruit or JVD.  Pulmonary:  Good air movement, no audible wheezing, no use of accessory muscles.  Cardiac: RRR, normal S1, S2, no Murmurs. Vascular:  mild trophic changes, no open wounds Vessel Right Left  Radial Palpable Palpable  Gastrointestinal: soft, non-distended. No guarding/no peritoneal signs.  Musculoskeletal: M/S 5/5 throughout.  No visible deformity.  Neurologic: CN 2-12 intact. Pain and light touch intact in extremities.  Symmetrical.  Speech is fluent. Motor exam as listed above. Psychiatric: Judgment intact, Mood & affect appropriate for pt's clinical situation. Dermatologic: No rashes or ulcers noted.  No changes consistent with cellulitis.   CBC Lab Results   Component Value Date   WBC 6.0 08/30/2023   HGB 12.2 08/30/2023   HCT 37.9 08/30/2023   MCV 92.2 08/30/2023   PLT 331 08/30/2023    BMET    Component Value Date/Time   NA 137 08/17/2022 2352   NA 141 08/30/2011 1923   K 4.0 08/17/2022 2352   K 3.8 08/30/2011 1923   CL 102 08/17/2022 2352   CL 107 08/30/2011 1923   CO2 27 08/17/2022 2352   CO2 21 08/30/2011 1923   GLUCOSE 93 08/17/2022 2352   GLUCOSE 70 08/30/2011 1923   BUN 14 08/17/2022 2352   BUN 7 08/30/2011 1923   CREATININE 0.60 08/17/2022 2352   CREATININE 0.59 (L) 08/30/2011 1923   CALCIUM  8.5 (L) 08/17/2022 2352   CALCIUM  8.2 (L) 08/30/2011 1923   GFRNONAA >60 08/17/2022 2352   GFRNONAA >60 08/30/2011 1923   GFRAA >60 03/04/2018 1042   GFRAA >60 08/30/2011 1923   CrCl cannot be calculated (Patient's most recent lab result is older than the maximum 21 days allowed.).  COAG No results found for: INR, PROTIME  Radiology No results found.   Assessment/Plan There are no diagnoses linked to this encounter.   Cordella Shawl, MD  07/13/2024 8:16 AM      [1]  No outpatient medications have been marked as taking for the 07/17/24 encounter (Appointment) with Shawl, Cordella MATSU, MD.  [2]  Social History Tobacco Use   Smoking status: Never   Smokeless tobacco: Never  Vaping Use   Vaping status: Never Used  Substance Use Topics   Alcohol use: Not Currently   Drug use: Never  [3] No Known Allergies  "

## 2024-07-17 ENCOUNTER — Ambulatory Visit (INDEPENDENT_AMBULATORY_CARE_PROVIDER_SITE_OTHER): Admitting: Vascular Surgery

## 2024-07-17 DIAGNOSIS — I7300 Raynaud's syndrome without gangrene: Secondary | ICD-10-CM

## 2024-07-17 DIAGNOSIS — M0579 Rheumatoid arthritis with rheumatoid factor of multiple sites without organ or systems involvement: Secondary | ICD-10-CM
